# Patient Record
Sex: Female | Born: 1985 | Race: Black or African American | Hispanic: No | Marital: Single | Smoking: Current every day smoker
Health system: Southern US, Community
[De-identification: ages and names within clinical notes are randomized; demographics above are authoritative.]

## PROBLEM LIST (undated history)

## (undated) ENCOUNTER — Inpatient Hospital Stay: Payer: Self-pay

## (undated) HISTORY — PX: INDUCED ABORTION: SHX677

---

## 2013-09-09 ENCOUNTER — Emergency Department: Payer: Self-pay | Admitting: Emergency Medicine

## 2016-06-01 ENCOUNTER — Emergency Department
Admission: EM | Admit: 2016-06-01 | Discharge: 2016-06-01 | Disposition: A | Discharge status: Home / Self Care | Payer: Medicaid Other | Attending: Emergency Medicine | Admitting: Emergency Medicine

## 2016-06-01 ENCOUNTER — Encounter: Payer: Self-pay | Admitting: Emergency Medicine

## 2016-06-01 DIAGNOSIS — R1030 Lower abdominal pain, unspecified: Secondary | ICD-10-CM | POA: Diagnosis not present

## 2016-06-01 DIAGNOSIS — O26892 Other specified pregnancy related conditions, second trimester: Secondary | ICD-10-CM | POA: Diagnosis not present

## 2016-06-01 DIAGNOSIS — F17200 Nicotine dependence, unspecified, uncomplicated: Secondary | ICD-10-CM | POA: Insufficient documentation

## 2016-06-01 DIAGNOSIS — Z3A16 16 weeks gestation of pregnancy: Secondary | ICD-10-CM | POA: Insufficient documentation

## 2016-06-01 DIAGNOSIS — R102 Pelvic and perineal pain: Secondary | ICD-10-CM | POA: Insufficient documentation

## 2016-06-01 LAB — URINALYSIS COMPLETE WITH MICROSCOPIC (ARMC ONLY)
BACTERIA UA: NONE SEEN
Bilirubin Urine: NEGATIVE
GLUCOSE, UA: NEGATIVE mg/dL
Nitrite: NEGATIVE
PROTEIN: NEGATIVE mg/dL
SPECIFIC GRAVITY, URINE: 1.012 (ref 1.005–1.030)
pH: 6 (ref 5.0–8.0)

## 2016-06-01 LAB — COMPREHENSIVE METABOLIC PANEL
ALBUMIN: 3.3 g/dL — AB (ref 3.5–5.0)
ALT: 36 U/L (ref 14–54)
ANION GAP: 6 (ref 5–15)
AST: 35 U/L (ref 15–41)
Alkaline Phosphatase: 42 U/L (ref 38–126)
BILIRUBIN TOTAL: 0.4 mg/dL (ref 0.3–1.2)
BUN: 6 mg/dL (ref 6–20)
CO2: 23 mmol/L (ref 22–32)
Calcium: 8.5 mg/dL — ABNORMAL LOW (ref 8.9–10.3)
Chloride: 104 mmol/L (ref 101–111)
Creatinine, Ser: 0.46 mg/dL (ref 0.44–1.00)
GFR calc Af Amer: >60 mL/min (ref >60)
GFR calc non Af Amer: >60 mL/min (ref >60)
GLUCOSE: 77 mg/dL (ref 65–99)
POTASSIUM: 3.5 mmol/L (ref 3.5–5.1)
SODIUM: 133 mmol/L — AB (ref 135–145)
TOTAL PROTEIN: 6.5 g/dL (ref 6.5–8.1)

## 2016-06-01 LAB — LIPASE, BLOOD: Lipase: 42 U/L (ref 11–51)

## 2016-06-01 LAB — CBC
HEMATOCRIT: 34.6 % — AB (ref 35.0–47.0)
HEMOGLOBIN: 12.2 g/dL (ref 12.0–16.0)
MCH: 34.5 pg — AB (ref 26.0–34.0)
MCHC: 35.3 g/dL (ref 32.0–36.0)
MCV: 97.6 fL (ref 80.0–100.0)
Platelets: 243 10*3/uL (ref 150–440)
RBC: 3.55 MIL/uL — ABNORMAL LOW (ref 3.80–5.20)
RDW: 14 % (ref 11.5–14.5)
WBC: 10.8 10*3/uL (ref 3.6–11.0)

## 2016-06-01 LAB — HCG, QUANTITATIVE, PREGNANCY: HCG, BETA CHAIN, QUANT, S: 35222 m[IU]/mL — AB (ref <5)

## 2016-06-01 NOTE — ED Provider Notes (Signed)
[facility] Emergency Department Provider Note   ____________________________________________    I have reviewed the triage vital signs and the nursing notes.   HISTORY  Chief Complaint Abdominal Pain   HPI Lynn Silva is a 30 y.o. female who presents after an episode of cramping this morning. Patient is [redacted] weeks pregnant, receives OB care at the health department, has gender scan in 4 days at [facility]. Patient reports she was attempting to have a bowel movement this morning and developed a cramping sensation in her pelvis bilaterally. She realized that she had not felt the baby move in 2 days and became concerned. She has no pain, no cramping, no vaginal bleeding currently, she wants to make sure the baby is okay.   History reviewed. No pertinent past medical history.  There are no active problems to display for this patient.   Past Surgical History:  Procedure Laterality Date  . INDUCED ABORTION      Prior to Admission medications   Not on File     Allergies Review of patient's allergies indicates no known allergies.  History reviewed. No pertinent family history.  Social History Social History  Substance Use Topics  . Smoking status: Current Every Day Smoker  . Smokeless tobacco: Never Used  . Alcohol use No    Review of Systems  Constitutional: No fever/chills Eyes: No visual changes.  ENT: No sore throat. Cardiovascular: Denies chest pain. Respiratory: Denies shortness of breath. Gastrointestinal: No abdominal painCurrently.  No nausea, no vomiting.   Genitourinary: Negative for dysuria. No vaginal bleeding Musculoskeletal: Negative for back pain. Skin: Negative for rash. Neurological: Negative for headaches   10-point ROS otherwise negative.  ____________________________________________   PHYSICAL EXAM:  VITAL SIGNS: ED Triage Vitals [06/01/16 1104]  Enc Vitals Group     BP (!) 102/53     Pulse Rate 70     Resp  20     Temp 98.2 F (36.8 C)     Temp Source Oral     SpO2 100 %     Weight 131 lb (59.4 kg)     Height 5\' 5"  (1.651 m)     Head Circumference      Peak Flow      Pain Score 6     Pain Loc      Pain Edu?      Excl. in GC?     Constitutional: Alert and oriented. No acute distress. Pleasant and interactive Eyes: Conjunctivae are normal.  Head: Atraumatic. Nose: No congestion/rhinnorhea. Mouth/Throat: Mucous membranes are moist.   Neck:  Painless ROM Cardiovascular: Normal rate, regular rhythm. Grossly normal heart sounds.  Good peripheral circulation. Respiratory: Normal respiratory effort.  No retractions. Lungs CTAB. Gastrointestinal: Soft and nontender. No distention.  No CVA tenderness. Genitourinary: deferred Musculoskeletal: No lower extremity tenderness nor edema.  Warm and well perfused Neurologic:  Normal speech and language. No gross focal neurologic deficits are appreciated.  Skin:  Skin is warm, dry and intact. No rash noted. Psychiatric: Mood and affect are normal. Speech and behavior are normal.  ____________________________________________   LABS (all labs ordered are listed, but only abnormal results are displayed)  Labs Reviewed  COMPREHENSIVE METABOLIC PANEL - Abnormal; Notable for the following:       Result Value   Sodium 133 (*)    Calcium 8.5 (*)    Albumin 3.3 (*)    All other components within normal limits  CBC - Abnormal; Notable for the  following:    RBC 3.55 (*)    HCT 34.6 (*)    MCH 34.5 (*)    All other components within normal limits  URINALYSIS COMPLETEWITH MICROSCOPIC ([facility] ONLY) - Abnormal; Notable for the following:    Color, Urine YELLOW (*)    APPearance CLOUDY (*)    Ketones, ur 1+ (*)    Hgb urine dipstick 1+ (*)    Leukocytes, UA 2+ (*)    Squamous Epithelial / LPF TOO NUMEROUS TO COUNT (*)    All other components within normal limits  HCG, QUANTITATIVE, PREGNANCY - Abnormal; Notable for the following:    hCG, Beta Chain,  Quant, S 35,222 (*)    All other components within normal limits  LIPASE, BLOOD   ____________________________________________  EKG  None ____________________________________________  RADIOLOGY  None ____________________________________________   PROCEDURES  Procedure(s) performed:   EMBU: fetal movement noted, HR 158, patient reassured  Critical Care performed: No ____________________________________________   INITIAL IMPRESSION / ASSESSMENT AND PLAN / ED COURSE  Pertinent labs & imaging results that were available during my care of the patient were reviewed by me and considered in my medical decision making (see chart for details).  A short well-appearing and in no distress. Vital signs are unremarkable. No abdominal tenderness to palpation. She has no symptoms and has not had any symptoms for several hours. No vaginal bleeding. Bedside ultrasound was reassuring to the patient, she has OB follow-up  Clinical Course   ____________________________________________   FINAL CLINICAL IMPRESSION(S) / ED DIAGNOSES  Final diagnoses:  Lower abdominal pain      NEW MEDICATIONS STARTED DURING THIS VISIT:  New Prescriptions   No medications on file     Note:  This document was prepared using Dragon voice recognition software and may include unintentional dictation errors.    Jene Everyobert Leslee Haueter, MD 06/01/16 1537

## 2016-06-01 NOTE — ED Notes (Signed)
Patient did not have to use the restroom at this time, patient was given a cup to catch some urine when she has to go.

## 2016-06-01 NOTE — ED Triage Notes (Signed)
Pt to ed with c/o abd pain and pelvic pain and pressure since this am.  Pt reports she is approx [redacted] weeks pregnant.  Has been receiving care at health dept.  Pt reports abortion in Jan of this year, G3 P1.  Denies vaginal bleeding at this time.

## 2016-08-29 ENCOUNTER — Encounter: Payer: Self-pay | Admitting: *Deleted

## 2016-08-29 ENCOUNTER — Observation Stay
Admission: EM | Admit: 2016-08-29 | Discharge: 2016-08-29 | Disposition: A | Discharge status: Home / Self Care | Payer: Medicaid Other | Attending: Obstetrics & Gynecology | Admitting: Obstetrics & Gynecology

## 2016-08-29 DIAGNOSIS — R109 Unspecified abdominal pain: Secondary | ICD-10-CM | POA: Insufficient documentation

## 2016-08-29 DIAGNOSIS — O26893 Other specified pregnancy related conditions, third trimester: Principal | ICD-10-CM | POA: Insufficient documentation

## 2016-08-29 DIAGNOSIS — Z3A30 30 weeks gestation of pregnancy: Secondary | ICD-10-CM | POA: Insufficient documentation

## 2016-08-29 DIAGNOSIS — O26899 Other specified pregnancy related conditions, unspecified trimester: Secondary | ICD-10-CM | POA: Diagnosis present

## 2016-08-29 LAB — FETAL FIBRONECTIN: FETAL FIBRONECTIN: NEGATIVE

## 2016-08-29 LAB — URINALYSIS COMPLETE WITH MICROSCOPIC (ARMC ONLY)
BILIRUBIN URINE: NEGATIVE
GLUCOSE, UA: NEGATIVE mg/dL
HGB URINE DIPSTICK: NEGATIVE
KETONES UR: NEGATIVE mg/dL
NITRITE: NEGATIVE
Protein, ur: NEGATIVE mg/dL
RBC / HPF: NONE SEEN RBC/hpf (ref 0–5)
SPECIFIC GRAVITY, URINE: 1.002 — AB (ref 1.005–1.030)
pH: 8 (ref 5.0–8.0)

## 2016-08-29 MED ORDER — ONDANSETRON HCL 4 MG/2ML IJ SOLN: 4.0000 mg | Freq: Four times a day (QID) | INTRAMUSCULAR | Status: DC | PRN

## 2016-08-29 MED ORDER — ACETAMINOPHEN 325 MG PO TABS: 650.0000 mg | ORAL_TABLET | ORAL | Status: DC | PRN

## 2016-08-29 NOTE — Discharge Summary (Signed)
Physician Discharge Summary  Patient ID: Lynn EndoChelsea T Licea MRN: [id number] DOB/AGE: 29/11/1985 28 y.o.  Admit date: 08/29/2016 Discharge date: 08/29/2016  Admission Diagnoses:Pt presents at 30 weeks with upper and lower abdominal pain this am, no VB or ROM or ctxs.  See H&P.    Discharge Diagnoses:  Active Problems:   Abdominal pain affecting pregnancy  Discharged Condition: good  Hospital Course: Pt seen and examined, negative work up for PTL, neg fFN and UA.  Fetal heart rate reassuring.  [facility]    Consults: None  Significant Diagnostic Studies: A NST procedure was performed with FHR monitoring and a normal baseline established, appropriate time of 20-40 minutes of evaluation, and accels >2 seen w 15x15 characteristics.  Results show a REACTIVE NST.   Treatments: none  Discharge Exam: Temperature 98.9 F (37.2 C), temperature source Oral, height 5\' 5"  (1.651 m), weight 140 lb (63.5 kg), last menstrual period 01/29/2016. per HPI  Disposition: 01-Home or Self Care     Medication List    You have not been prescribed any medications.      Signed: Letitia LibraRobert Paul Brenn Deziel 08/29/2016, 3:48 PM

## 2016-08-29 NOTE — H&P (Signed)
Obstetrics Admission History & Physical   CC: Abd Pain   HPI:  30 y.o. G3P1011 @ 5244w3d (11/04/2016, Date entered prior to episode creation). Admitted on 08/29/2016:   Patient Active Problem List   Diagnosis Date Noted  . Abdominal pain affecting pregnancy 08/29/2016     Presents for waking up this morning with upper and lower abdominal pains, no LOF or VB, good FM, no other compl this preg, prior NSVD full term 9 years ago.  Prenatal care at: at [facility]  PMHx: No past medical history on file. PSHx:  Past Surgical History:  Procedure Laterality Date  . INDUCED ABORTION     Medications:  No prescriptions prior to admission.   Allergies: has No Known Allergies. OBHx:  OB History  Gravida Para Term Preterm AB Living  3 1 1   1 1   SAB TAB Ectopic Multiple Live Births               # Outcome Date GA Lbr Len/2nd Weight Sex Delivery Anes PTL Lv  3 Current           2 AB           1 Term              FAO:ZHYQMVHQ/IONGEXBMWUXLFHx:Negative/unremarkable except as detailed in HPI. Soc Hx: Alcohol: none and Recreational drug use: none  Objective:   Vitals:   08/29/16 1306  Temp: 98.9 F (37.2 C)   General: Well nourished, well developed female in no acute distress.  Skin: Warm and dry.  Cardiovascular:Regular rate and rhythm. Respiratory: Clear to auscultation bilateral. Normal respiratory effort Abdomen: mild upper abd pain to palpation, no uterine or lower abd pain, ND, gravid, FHT 140s Neuro/Psych: Normal mood and affect.   Pelvic exam: is not limited by body habitus EGBUS: within normal limits Vagina: within normal limits and with normal mucosa blood in the vault Cervix: closed/50/-3 Uterus: No contractions observed for 30 minutes.  Adnexa: not evaluated  EFM:FHR: 140 bpm Toco: None  Assessment & Plan:   30 y.o. G3P1011 @ 2044w3d, Admitted on 08/29/2016: Abd Pain in Third Trimester Pregnancy; no S/Sx PTL fFN UA Monitoring

## 2016-08-29 NOTE — Discharge Instructions (Signed)
Follow up with your OB provider as scheduled.  Call or return to L&D for more than 6 contractions in one hour, heavy vaginal bleeding like a period, if your water breaks, or decreased fetal movement.

## 2016-08-29 NOTE — Progress Notes (Signed)
[facility] [address] [id number] [place] [address] Phone: [phone] Fax: [phone]  August 29, 2016  Patient: Lynn EndoChelsea T Silva  Date of Birth: 06/04/1986  Date of Visit: 08/29/2016    To Whom It May Concern:  Mick SellChelsea Silva was seen and treated in our [facility] on 08/29/2016. Lynn Silva  may return to work on 08/30/16; restrictions for remainder of pregnancy include allowing her to have frequent breaks and allow to sit at times during her shift, due to third trimester protection of pregnancy..  Sincerely,  Annamarie MajorPaul Ephriam Turman, MD [facility]

## 2016-10-01 NOTE — L&D Delivery Note (Signed)
Delivery Note Primary OB: [facility] Delivery Physician: Annamarie MajorPaul Maysel Mccolm, MD Gestational Age: Full term Antepartum complications: Substance abuse, Smoking history Intrapartum complications: None  A viable Female was delivered via vertex perentation.  Apgars:9 ,9  Weight:  pending .   Placenta status: spontaneous and Intact.  Cord: 3+ vessels;  with the following complications: none.  Anesthesia:  epidural Episiotomy:  none Lacerations:  none Suture Repair: none Est. Blood Loss (mL):  200 mL  Mom to postpartum.  Baby to Couplet care / Skin to Skin.  Annamarie MajorPaul Myonna Chisom, MD Dept of OB/GYN [phone]

## 2016-10-16 ENCOUNTER — Observation Stay
Admission: EM | Admit: 2016-10-16 | Discharge: 2016-10-16 | Disposition: A | Discharge status: Home / Self Care | Payer: Medicaid Other | Attending: Obstetrics and Gynecology | Admitting: Obstetrics and Gynecology

## 2016-10-16 ENCOUNTER — Encounter: Payer: Self-pay | Admitting: *Deleted

## 2016-10-16 DIAGNOSIS — O26893 Other specified pregnancy related conditions, third trimester: Secondary | ICD-10-CM | POA: Diagnosis not present

## 2016-10-16 DIAGNOSIS — R109 Unspecified abdominal pain: Secondary | ICD-10-CM | POA: Diagnosis not present

## 2016-10-16 DIAGNOSIS — F17200 Nicotine dependence, unspecified, uncomplicated: Secondary | ICD-10-CM | POA: Diagnosis not present

## 2016-10-16 DIAGNOSIS — O99333 Smoking (tobacco) complicating pregnancy, third trimester: Secondary | ICD-10-CM | POA: Diagnosis not present

## 2016-10-16 DIAGNOSIS — Z3A37 37 weeks gestation of pregnancy: Secondary | ICD-10-CM | POA: Insufficient documentation

## 2016-10-16 DIAGNOSIS — R112 Nausea with vomiting, unspecified: Secondary | ICD-10-CM | POA: Insufficient documentation

## 2016-10-16 LAB — URINE DRUG SCREEN, QUALITATIVE (ARMC ONLY)
Amphetamines, Ur Screen: NOT DETECTED
BARBITURATES, UR SCREEN: NOT DETECTED
BENZODIAZEPINE, UR SCRN: NOT DETECTED
CANNABINOID 50 NG, UR ~~LOC~~: POSITIVE — AB
COCAINE METABOLITE, UR ~~LOC~~: POSITIVE — AB
MDMA (Ecstasy)Ur Screen: NOT DETECTED
METHADONE SCREEN, URINE: NOT DETECTED
OPIATE, UR SCREEN: NOT DETECTED
Phencyclidine (PCP) Ur S: NOT DETECTED
Tricyclic, Ur Screen: NOT DETECTED

## 2016-10-16 MED ORDER — ONDANSETRON 4 MG PO TBDP: 4.0000 mg | ORAL_TABLET | Freq: Three times a day (TID) | ORAL | 0 refills | Status: DC | PRN

## 2016-10-16 MED ORDER — LACTATED RINGERS IV BOLUS (SEPSIS)
750.0000 mL | Freq: Once | INTRAVENOUS | Status: AC
  Administered 2016-10-16: 750 mL via INTRAVENOUS

## 2016-10-16 MED ORDER — LACTATED RINGERS IV BOLUS (SEPSIS)
250.0000 mL | INTRAVENOUS | Status: AC
  Administered 2016-10-16: 1000 mL via INTRAVENOUS

## 2016-10-16 MED ORDER — ONDANSETRON HCL 4 MG/2ML IJ SOLN
INTRAMUSCULAR | Status: AC
  Filled 2016-10-16: qty 2

## 2016-10-16 MED ORDER — ONDANSETRON HCL 4 MG/2ML IJ SOLN
4.0000 mg | Freq: Once | INTRAMUSCULAR | Status: AC
  Administered 2016-10-16: 4 mg via INTRAVENOUS

## 2016-10-16 NOTE — OB Triage Note (Signed)
Presents with complaint of nausea vomiting and diarrhea since last night, also not sure if she is having contractions.  States she hasnt been able to keep anything down since 830pm last night. Denies any bleeding or leaking of fluid.

## 2016-10-16 NOTE — Discharge Summary (Signed)
Physician Final Progress Note  Patient ID: Lynn EndoChelsea Silva Carel MRN: [id number] DOB/AGE: 30/11/1985 30 y.o.  Admit date: 10/16/2016 Admitting provider: Tresea MallJane Maeby Vankleeck, CNM Discharge date: 10/16/2016   Admission Diagnoses: G3P1011 at 39107w2d with c/o nausea, vomiting, abdominal pain since last night at 10 pm. The last time she vomited was prior to arrival at L&D. She was unable to keep fluids down overnight. Pt admits positive fetal movement. She denies LOF, VB  Discharge Diagnoses:  Active Problems:   Labor and delivery indication for care or intervention IUP at 37107w2d with reactive NST, not in labor  History of Present Illness: Pt admitted for observation, placed on monitors, IV started with 1000ml bolus of LR. Pt is tolerating PO liquids during hospital stay.   History reviewed. No pertinent past medical history.  Past Surgical History:  Procedure Laterality Date  . INDUCED ABORTION      No current facility-administered medications on file prior to encounter.    No current outpatient prescriptions on file prior to encounter.    No Known Allergies  Social History   Social History  . Marital status: Single    Spouse name: N/A  . Number of children: N/A  . Years of education: N/A   Occupational History  . Not on file.   Social History Main Topics  . Smoking status: Current Every Day Smoker  . Smokeless tobacco: Never Used  . Alcohol use No  . Drug use: No  . Sexual activity: Yes   Other Topics Concern  . Not on file   Social History Narrative  . No narrative on file    Physical Exam: Temp 98.3 F (36.8 C) (Oral)   Resp 18   Ht 5\' 6"  (1.676 m)   Wt 150 lb (68 kg)   LMP 01/29/2016   BMI 24.21 kg/m   Gen: NAD CV: RRR Pulm: CTAB Pelvic: 1.5/60/-2 on admission and no change prior to discharge Ext: no evidence of DVT Toco: q 3-7 minutes Fetal Well Being: 150 bpm, moderate variability, +accelerations, -decelerations Category I  Consults: None  Significant  Findings/ Diagnostic Studies: labs:   Results for Lynn Silva, Lynn Silva (MRN [id number]) as of 10/16/2016 16:04  Ref. Range 10/16/2016 13:00  Amphetamines, Ur Screen Latest Ref Range: NONE DETECTED  NONE DETECTED  Barbiturates, Ur Screen Latest Ref Range: NONE DETECTED  NONE DETECTED  Benzodiazepine, Ur Scrn Latest Ref Range: NONE DETECTED  NONE DETECTED  Cocaine Metabolite,Ur [place] Latest Ref Range: NONE DETECTED  POSITIVE (A)  Methadone Scn, Ur Latest Ref Range: NONE DETECTED  NONE DETECTED  MDMA (Ecstasy)Ur Screen Latest Ref Range: NONE DETECTED  NONE DETECTED  Cannabinoid 50 Ng, Ur [place] Latest Ref Range: NONE DETECTED  POSITIVE (A)  Opiate, Ur Screen Latest Ref Range: NONE DETECTED  NONE DETECTED  Phencyclidine (PCP) Ur S Latest Ref Range: NONE DETECTED  NONE DETECTED  Tricyclic, Ur Screen Latest Ref Range: NONE DETECTED  NONE DETECTED    Procedures: NST  Discharge Condition: good  Disposition: 01-Home or Self Care  Diet: Regular diet  Discharge Activity: Activity as tolerated  Instructed patient to not use Cocaine or Marijuana during the pregnancy Instructed to take Tylenol PM today to ease pain of contractions and get rest  Discharge Instructions    Discharge activity:  No Restrictions    Complete by:  As directed    Discharge diet:  No restrictions    Complete by:  As directed    Fetal Kick Count:  Lie on our left side  for one hour after a meal, and count the number of times your baby kicks.  If it is less than 5 times, get up, move around and drink some juice.  Repeat the test 30 minutes later.  If it is still less than 5 kicks in an hour, notify your doctor.    Complete by:  As directed    LABOR:  When conractions begin, you should start to time them from the beginning of one contraction to the beginning  of the next.  When contractions are 5 - 10 minutes apart or less and have been regular for at least an hour, you should call your health care provider.    Complete by:  As  directed    No sexual activity restrictions    Complete by:  As directed    Notify physician for bleeding from the vagina    Complete by:  As directed    Notify physician for blurring of vision or spots before the eyes    Complete by:  As directed    Notify physician for chills or fever    Complete by:  As directed    Notify physician for fainting spells, "black outs" or loss of consciousness    Complete by:  As directed    Notify physician for increase in vaginal discharge    Complete by:  As directed    Notify physician for leaking of fluid    Complete by:  As directed    Notify physician for pain or burning when urinating    Complete by:  As directed    Notify physician for pelvic pressure (sudden increase)    Complete by:  As directed    Notify physician for severe or continued nausea or vomiting    Complete by:  As directed    Notify physician for sudden gushing of fluid from the vagina (with or without continued leaking)    Complete by:  As directed    Notify physician for sudden, constant, or occasional abdominal pain    Complete by:  As directed    Notify physician if baby moving less than usual    Complete by:  As directed      Allergies as of 10/16/2016   No Known Allergies     Medication List    Zofran 4 mg ODT every 8 hours as needed for nausea and vomiting    Follow-up Information    [facility] Follow up.   Why:  schedule follow up prenatal appointment as soon as possible Contact information: [address] [place] [address] [phone]           Total time spent taking care of this patient: 35 minutes  Signed: Tresea Mall, CNM  10/16/2016, 12:46 PM

## 2016-10-21 ENCOUNTER — Inpatient Hospital Stay: Payer: Medicaid Other | Admitting: Anesthesiology

## 2016-10-21 ENCOUNTER — Inpatient Hospital Stay
Admission: EM | Admit: 2016-10-21 | Discharge: 2016-10-23 | DRG: 775 | Disposition: A | Discharge status: Home / Self Care | Payer: Medicaid Other | Attending: Obstetrics & Gynecology | Admitting: Obstetrics & Gynecology

## 2016-10-21 DIAGNOSIS — O99324 Drug use complicating childbirth: Secondary | ICD-10-CM | POA: Diagnosis present

## 2016-10-21 DIAGNOSIS — F17200 Nicotine dependence, unspecified, uncomplicated: Secondary | ICD-10-CM | POA: Diagnosis present

## 2016-10-21 DIAGNOSIS — F1210 Cannabis abuse, uncomplicated: Secondary | ICD-10-CM | POA: Diagnosis present

## 2016-10-21 DIAGNOSIS — Z3A38 38 weeks gestation of pregnancy: Secondary | ICD-10-CM | POA: Diagnosis not present

## 2016-10-21 DIAGNOSIS — Z3493 Encounter for supervision of normal pregnancy, unspecified, third trimester: Secondary | ICD-10-CM | POA: Diagnosis present

## 2016-10-21 DIAGNOSIS — F1410 Cocaine abuse, uncomplicated: Secondary | ICD-10-CM | POA: Diagnosis present

## 2016-10-21 DIAGNOSIS — O99824 Streptococcus B carrier state complicating childbirth: Secondary | ICD-10-CM | POA: Diagnosis present

## 2016-10-21 DIAGNOSIS — F1910 Other psychoactive substance abuse, uncomplicated: Secondary | ICD-10-CM | POA: Diagnosis present

## 2016-10-21 DIAGNOSIS — O99334 Smoking (tobacco) complicating childbirth: Secondary | ICD-10-CM | POA: Diagnosis present

## 2016-10-21 LAB — URINE DRUG SCREEN, QUALITATIVE (ARMC ONLY)
Amphetamines, Ur Screen: NOT DETECTED
Barbiturates, Ur Screen: NOT DETECTED
Benzodiazepine, Ur Scrn: NOT DETECTED
COCAINE METABOLITE, UR ~~LOC~~: POSITIVE — AB
Cannabinoid 50 Ng, Ur ~~LOC~~: POSITIVE — AB
MDMA (ECSTASY) UR SCREEN: NOT DETECTED
METHADONE SCREEN, URINE: NOT DETECTED
Opiate, Ur Screen: NOT DETECTED
Phencyclidine (PCP) Ur S: NOT DETECTED
TRICYCLIC, UR SCREEN: NOT DETECTED

## 2016-10-21 LAB — CBC
HEMATOCRIT: 37.3 % (ref 35.0–47.0)
Hemoglobin: 12.9 g/dL (ref 12.0–16.0)
MCH: 33.1 pg (ref 26.0–34.0)
MCHC: 34.6 g/dL (ref 32.0–36.0)
MCV: 95.9 fL (ref 80.0–100.0)
Platelets: 199 10*3/uL (ref 150–440)
RBC: 3.89 MIL/uL (ref 3.80–5.20)
RDW: 14.1 % (ref 11.5–14.5)
WBC: 12.7 10*3/uL — ABNORMAL HIGH (ref 3.6–11.0)

## 2016-10-21 LAB — TYPE AND SCREEN
ABO/RH(D): O POS
Antibody Screen: NEGATIVE

## 2016-10-21 MED ORDER — TETANUS-DIPHTH-ACELL PERTUSSIS 5-2.5-18.5 LF-MCG/0.5 IM SUSP: 0.5000 mL | Freq: Once | INTRAMUSCULAR | Status: DC

## 2016-10-21 MED ORDER — FENTANYL 2.5 MCG/ML W/ROPIVACAINE 0.2% IN NS 100 ML EPIDURAL INFUSION (ARMC-ANES): 10.0000 mL/h | EPIDURAL | Status: DC

## 2016-10-21 MED ORDER — OXYTOCIN BOLUS FROM INFUSION
500.0000 mL | Freq: Once | INTRAVENOUS | Status: AC
  Administered 2016-10-21: 500 mL via INTRAVENOUS

## 2016-10-21 MED ORDER — PHENYLEPHRINE 40 MCG/ML (10ML) SYRINGE FOR IV PUSH (FOR BLOOD PRESSURE SUPPORT)
80.0000 ug | PREFILLED_SYRINGE | INTRAVENOUS | Status: DC | PRN
  Filled 2016-10-21: qty 5

## 2016-10-21 MED ORDER — EPHEDRINE 5 MG/ML INJ
10.0000 mg | INTRAVENOUS | Status: DC | PRN
  Filled 2016-10-21: qty 2

## 2016-10-21 MED ORDER — ACETAMINOPHEN 325 MG PO TABS
650.0000 mg | ORAL_TABLET | ORAL | Status: DC | PRN
  Administered 2016-10-22: 650 mg via ORAL
  Filled 2016-10-21: qty 2

## 2016-10-21 MED ORDER — SODIUM CHLORIDE 0.9 % IV SOLN: 250.0000 mL | INTRAVENOUS | Status: DC | PRN

## 2016-10-21 MED ORDER — ONDANSETRON HCL 4 MG/2ML IJ SOLN: 4.0000 mg | INTRAMUSCULAR | Status: DC | PRN

## 2016-10-21 MED ORDER — LIDOCAINE HCL (PF) 1 % IJ SOLN
INTRAMUSCULAR | Status: AC
  Filled 2016-10-21: qty 30

## 2016-10-21 MED ORDER — DIBUCAINE 1 % RE OINT: 1.0000 "application " | TOPICAL_OINTMENT | RECTAL | Status: DC | PRN

## 2016-10-21 MED ORDER — SODIUM CHLORIDE 0.9% FLUSH: 3.0000 mL | Freq: Two times a day (BID) | INTRAVENOUS | Status: DC

## 2016-10-21 MED ORDER — DIPHENHYDRAMINE HCL 50 MG/ML IJ SOLN: 12.5000 mg | INTRAMUSCULAR | Status: DC | PRN

## 2016-10-21 MED ORDER — LACTATED RINGERS IV SOLN
500.0000 mL | Freq: Once | INTRAVENOUS | Status: AC
  Administered 2016-10-21: 500 mL via INTRAVENOUS

## 2016-10-21 MED ORDER — SENNOSIDES-DOCUSATE SODIUM 8.6-50 MG PO TABS
2.0000 | ORAL_TABLET | ORAL | Status: DC
  Administered 2016-10-22 – 2016-10-23 (×2): 2 via ORAL
  Filled 2016-10-21 (×2): qty 2

## 2016-10-21 MED ORDER — SODIUM CHLORIDE 0.9% FLUSH: 3.0000 mL | INTRAVENOUS | Status: DC | PRN

## 2016-10-21 MED ORDER — ONDANSETRON HCL 4 MG/2ML IJ SOLN: 4.0000 mg | Freq: Four times a day (QID) | INTRAMUSCULAR | Status: DC | PRN

## 2016-10-21 MED ORDER — MISOPROSTOL 200 MCG PO TABS
ORAL_TABLET | ORAL | Status: AC
  Filled 2016-10-21: qty 4

## 2016-10-21 MED ORDER — BUTORPHANOL TARTRATE 1 MG/ML IJ SOLN: 1.0000 mg | INTRAMUSCULAR | Status: DC | PRN

## 2016-10-21 MED ORDER — LACTATED RINGERS IV SOLN: 500.0000 mL | INTRAVENOUS | Status: DC | PRN

## 2016-10-21 MED ORDER — ONDANSETRON HCL 4 MG PO TABS: 4.0000 mg | ORAL_TABLET | ORAL | Status: DC | PRN

## 2016-10-21 MED ORDER — OXYTOCIN 10 UNIT/ML IJ SOLN
INTRAMUSCULAR | Status: AC
  Filled 2016-10-21: qty 2

## 2016-10-21 MED ORDER — FENTANYL 2.5 MCG/ML W/ROPIVACAINE 0.2% IN NS 100 ML EPIDURAL INFUSION (ARMC-ANES)
EPIDURAL | Status: DC | PRN
  Administered 2016-10-21: 10 mL/h via EPIDURAL

## 2016-10-21 MED ORDER — DIPHENHYDRAMINE HCL 25 MG PO CAPS: 25.0000 mg | ORAL_CAPSULE | Freq: Four times a day (QID) | ORAL | Status: DC | PRN

## 2016-10-21 MED ORDER — ZOLPIDEM TARTRATE 5 MG PO TABS: 5.0000 mg | ORAL_TABLET | Freq: Every evening | ORAL | Status: DC | PRN

## 2016-10-21 MED ORDER — IBUPROFEN 600 MG PO TABS
600.0000 mg | ORAL_TABLET | Freq: Four times a day (QID) | ORAL | Status: DC
  Administered 2016-10-21 – 2016-10-23 (×7): 600 mg via ORAL
  Filled 2016-10-21 (×8): qty 1

## 2016-10-21 MED ORDER — SIMETHICONE 80 MG PO CHEW: 80.0000 mg | CHEWABLE_TABLET | ORAL | Status: DC | PRN

## 2016-10-21 MED ORDER — OXYTOCIN 40 UNITS IN LACTATED RINGERS INFUSION - SIMPLE MED
2.5000 [IU]/h | INTRAVENOUS | Status: DC
  Administered 2016-10-21: 2.5 [IU]/h via INTRAVENOUS
  Filled 2016-10-21: qty 1000

## 2016-10-21 MED ORDER — AMMONIA AROMATIC IN INHA
RESPIRATORY_TRACT | Status: AC
  Filled 2016-10-21: qty 10

## 2016-10-21 MED ORDER — SODIUM CHLORIDE 0.9 % IV SOLN
1.0000 g | INTRAVENOUS | Status: DC
  Administered 2016-10-21: 1 g via INTRAVENOUS
  Filled 2016-10-21: qty 1000

## 2016-10-21 MED ORDER — LACTATED RINGERS IV SOLN
INTRAVENOUS | Status: DC
  Administered 2016-10-21 (×2): via INTRAVENOUS

## 2016-10-21 MED ORDER — BENZOCAINE-MENTHOL 20-0.5 % EX AERO: 1.0000 "application " | INHALATION_SPRAY | CUTANEOUS | Status: DC | PRN

## 2016-10-21 MED ORDER — ACETAMINOPHEN 325 MG PO TABS: 650.0000 mg | ORAL_TABLET | ORAL | Status: DC | PRN

## 2016-10-21 MED ORDER — COCONUT OIL OIL: 1.0000 "application " | TOPICAL_OIL | Status: DC | PRN

## 2016-10-21 MED ORDER — SODIUM CHLORIDE 0.9 % IV SOLN
INTRAVENOUS | Status: AC
  Filled 2016-10-21: qty 2000

## 2016-10-21 MED ORDER — AMPICILLIN SODIUM 2 G IJ SOLR
2.0000 g | Freq: Once | INTRAMUSCULAR | Status: AC
  Administered 2016-10-21: 2 g via INTRAVENOUS

## 2016-10-21 MED ORDER — BUPIVACAINE HCL (PF) 0.25 % IJ SOLN
INTRAMUSCULAR | Status: DC | PRN
  Administered 2016-10-21: 5 mL via EPIDURAL

## 2016-10-21 MED ORDER — LIDOCAINE HCL (PF) 1 % IJ SOLN
INTRAMUSCULAR | Status: DC | PRN
  Administered 2016-10-21: 3 mL via SUBCUTANEOUS

## 2016-10-21 MED ORDER — LIDOCAINE-EPINEPHRINE (PF) 1.5 %-1:200000 IJ SOLN
INTRAMUSCULAR | Status: DC | PRN
  Administered 2016-10-21: 4 mL via EPIDURAL

## 2016-10-21 MED ORDER — WITCH HAZEL-GLYCERIN EX PADS: 1.0000 "application " | MEDICATED_PAD | CUTANEOUS | Status: DC | PRN

## 2016-10-21 MED ORDER — FENTANYL 2.5 MCG/ML W/ROPIVACAINE 0.2% IN NS 100 ML EPIDURAL INFUSION (ARMC-ANES)
EPIDURAL | Status: AC
  Filled 2016-10-21: qty 100

## 2016-10-21 NOTE — Anesthesia Procedure Notes (Signed)
Epidural Patient location during procedure: OB  Staffing Performed: anesthesiologist   Preanesthetic Checklist Completed: patient identified, site marked, surgical consent, pre-op evaluation, timeout performed, IV checked, risks and benefits discussed and monitors and equipment checked  Epidural Patient position: sitting Prep: Betadine Patient monitoring: heart rate, continuous pulse ox and blood pressure Approach: midline Location: L4-L5 Injection technique: LOR saline  Needle:  Needle type: Tuohy  Needle gauge: 17 G Needle length: 9 cm and 9 Needle insertion depth: 4 cm Catheter type: closed end flexible Catheter size: 19 Gauge Catheter at skin depth: 9 cm Test dose: negative and 1.5% lidocaine with Epi 1:200 K  Assessment Sensory level: T10 Events: blood not aspirated, injection not painful, no injection resistance, negative IV test and no paresthesia  Additional Notes   Patient tolerated the insertion well without complications.-SATD -IVTD. No paresthesia. Refer to [facility] nursing for VS and dosingReason for block:procedure for pain     

## 2016-10-21 NOTE — Anesthesia Preprocedure Evaluation (Signed)
Anesthesia Evaluation  Patient identified by MRN, date of birth, ID band Patient awake    Reviewed: Allergy & Precautions, H&P , NPO status , Patient's Chart, lab work & pertinent test results, reviewed documented beta blocker date and time   Airway Mallampati: II  TM Distance: >3 FB Neck ROM: full    Dental no notable dental hx. (+) Teeth Intact   Pulmonary neg pulmonary ROS, Current Smoker,    Pulmonary exam normal breath sounds clear to auscultation       Cardiovascular Exercise Tolerance: Good negative cardio ROS   Rhythm:regular Rate:Normal     Neuro/Psych negative neurological ROS  negative psych ROS   GI/Hepatic negative GI ROS, Neg liver ROS,   Endo/Other  negative endocrine ROSdiabetes  Renal/GU      Musculoskeletal   Abdominal   Peds  Hematology negative hematology ROS (+)   Anesthesia Other Findings   Reproductive/Obstetrics (+) Pregnancy                             Anesthesia Physical Anesthesia Plan  ASA: II  Anesthesia Plan: Epidural   Post-op Pain Management:    Induction:   Airway Management Planned:   Additional Equipment:   Intra-op Plan:   Post-operative Plan:   Informed Consent: I have reviewed the patients History and Physical, chart, labs and discussed the procedure including the risks, benefits and alternatives for the proposed anesthesia with the patient or authorized representative who has indicated his/her understanding and acceptance.     Plan Discussed with:   Anesthesia Plan Comments: (Pt with pos hx of cocaine with pos tox on 16th.  Denies any use since that time and understands the risks benefit profile of subsequent epidural anesthesia. She vehemently denies any repeat exposure of cocaine or other illicit drugs since the 16th.  JA)       Anesthesia Quick Evaluation

## 2016-10-21 NOTE — OB Triage Note (Signed)
Pt presents to triage, complaining of pain in lower abdomen that started around midnight coming consistently around every ten minutes now coming about every five minutes, she rates the pain 8. She has noticed some bleeding but no leaking or gushes of water.

## 2016-10-21 NOTE — Discharge Summary (Signed)
Obstetrical Discharge Summary  Date of Admission: 10/21/2016 Date of Discharge: 10/23/2016 Discharge Diagnosis: Term Pregnancy-delivered Primary OB:  [facility]   Delivery Note Primary OB: [facility] Delivery Physician: Paul Harris, MD Gestational Age: Full term Antepartum complications: Substance abuse, Smoking history Intrapartum complications: None  A viable Female was delivered via vertex perentation.  Apgars:9 ,9  Weight:  6#5.9oz (2890 gm) Placenta status: spontaneous and Intact.  Cord: 3+ vessels;  with the following complications: none.  Anesthesia:  epidural Episiotomy:  none Lacerations:  none Suture Repair: none Est. Blood Loss (mL):  200 mL  Mom to postpartum.  Baby to Couplet care / Skin to Skin.   Post partum course: Since the delivery, patient has tolerate activity, diet, and daily functions without difficulty or complication.  Min lochia.  No breast concerns at this time.  No signs of depression currently. Has been seen by social worker and CPS has been notified and will follow up on baby (positive cocaine and MJ in baby and mother). Patient to also follow up with [facility].  Postpartum Exam:General appearance: alert, no distress and distracted on her phone GI: Fundus firm and at U/ ML/ NT Extremities: no edema, redness or tenderness in the calves or thighs Lochia: minimal  Disposition: home with infant Rh Immune globulin given: not applicable Rubella vaccine given: not applicable Varicella vaccine given: not applicable Tdap vaccine given in AP or PP setting: yes Flu vaccine given in AP or PP setting: yes Contraception: condoms. Wants BTL, but awaiting Medicaid approval, then needs to sign 30 day papers.  Prenatal Labs: O POS//Rubella Immune (MMR x 2 )/ VI/RPR negative//HIV negative/HepB Surface Ag negative//plans to bottle feed  Plan:  Lynn Silva was discharged to home in good condition. Follow-up appointment with PNC provider in 6 weeks  Discharge  Medications: Allergies as of 10/23/2016   No Known Allergies     Medication List    STOP taking these medications   ondansetron 4 MG disintegrating tablet Commonly known as:  ZOFRAN ODT     TAKE these medications   CONCEPT DHA 53.5-38-1 MG Caps Take 30 capsules by mouth daily.   ibuprofen 600 MG tablet Commonly known as:  ADVIL,MOTRIN Take 1 tablet (600 mg total) by mouth every 6 (six) hours as needed for mild pain or moderate pain.        GUTIERREZ, COLLEEN, CNM  

## 2016-10-21 NOTE — Progress Notes (Signed)
  Labor Progress Note   31 y.o. [id number] @ 3923w0d , admitted for  Pregnancy, Labor Management.   Subjective:  Pain improved after epidural  Objective:  BP 107/72   Pulse 67   Temp 97.6 F (36.4 C) (Oral)   Resp 16   Ht 5\' 5"  (1.651 m)   Wt 68.9 kg (152 lb)   LMP 01/29/2016   BMI 25.29 kg/m  Abd: mild Extr: trace to 1+ bilateral pedal edema SVE: CERVIX: 7 cm dilated, 80 effaced, -2 station AROM clear  EFM: FHR: 140 bpm, variability: moderate,  accelerations:  Present,  decelerations:  Absent Toco: Frequency: Every 3 minutes Labs:  Current lab results:  Recent Labs  10/21/16 1124  WBC 12.7*  HGB 12.9  HCT 37.3     Assessment & Plan:  G3P1011 @ 2623w0d, admitted for  Pregnancy and Labor/Delivery Management  1. Pain management: epidural. 2. FWB: FHT category 1.  3. ID: GBS positive 4. Labor management: AROM, monitor Counseled on POS DRUG SCREEN for Cocaine and MJ; risks to baby; neonatal to monitor and assess baby per protocol,; rec not to breast feed with drugs in system  All discussed with patient, see orders

## 2016-10-21 NOTE — H&P (Signed)
Obstetrics Admission History & Physical   CC: Ctxs   HPI:  31 y.o. G3P1011 @ 2874w0d (11/04/2016, Date entered prior to episode creation). Admitted on 10/21/2016:   Patient Active Problem List   Diagnosis Date Noted  . Labor and delivery indication for care or intervention 10/16/2016  . Abdominal pain affecting pregnancy 08/29/2016     Presents for painful ctxs since last night and bloody show this am.  No ROM.  Good FM.  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 Prior NSVD 9 years ago. Prenatal care at: at [facility]  PMHx: No past medical history on file. PSHx:  Past Surgical History:  Procedure Laterality Date  . INDUCED ABORTION     Medications:  Prescriptions Prior to Admission  Medication Sig Dispense Refill Last Dose  . ondansetron (ZOFRAN ODT) 4 MG disintegrating tablet Take 1 tablet (4 mg total) by mouth every 8 (eight) hours as needed for nausea or vomiting. 12 tablet 0 10/20/2016   Allergies: has No Known Allergies. OBHx:  OB History  Gravida Para Term Preterm AB Living  3 1 1   1 1   SAB TAB Ectopic Multiple Live Births               # Outcome Date GA Lbr Len/2nd Weight Sex Delivery Anes PTL Lv  3 Current           2 AB           1 Term              ZOX:WRUEAVWU/[id number]:Negative/unremarkable except as detailed in HPI. Soc Hx: Current smoker, Alcohol: none and Recreational drug use: current: (MJ, Cocaine, denies recent use)  Objective:   Vitals:   10/21/16 1102 10/21/16 1103  BP: 107/62   Pulse: 68   Resp:  16  Temp:  97.6 F (36.4 C)   General: Well nourished, well developed female in no acute distress.  Skin: Warm and dry.  Cardiovascular:Regular rate and rhythm. Respiratory: Clear to auscultation bilateral. Normal respiratory effort Abdomen: marked Neuro/Psych: Normal mood and affect.   Pelvic exam: is not limited by body habitus EGBUS: within normal limits Vagina: within normal limits and with normal mucosa blood in the  vault Cervix: 5/90/0, BBOW Uterus: Spontaneous uterine activity  Adnexa: not evaluated  EFM:FHR: 140 bpm, variability: moderate,  accelerations:  Present,  decelerations:  Absent Toco: Frequency: Every 2-4 minutes   Perinatal info:  Blood type: O positive Rubella- Immune Varicella -Immune TDaP tetanus status unknown to the patient RPR NR / HIV Neg/ HBsAg Neg  GBS POS  Assessment & Plan:   31 y.o. G3P1011 @ 4174w0d, Admitted on 10/21/2016: ACTIVE LABOR     Admit for labor, Antibiotics for GBS prophylaxis, Observe for cervical change, Fetal Wellbeing Reassuring, Epidural when ready and AROM when Appropriate

## 2016-10-22 LAB — CBC
HCT: 36.8 % (ref 35.0–47.0)
HEMOGLOBIN: 12.7 g/dL (ref 12.0–16.0)
MCH: 33.3 pg (ref 26.0–34.0)
MCHC: 34.7 g/dL (ref 32.0–36.0)
MCV: 96.1 fL (ref 80.0–100.0)
Platelets: 184 10*3/uL (ref 150–440)
RBC: 3.83 MIL/uL (ref 3.80–5.20)
RDW: 14.1 % (ref 11.5–14.5)
WBC: 14.8 10*3/uL — ABNORMAL HIGH (ref 3.6–11.0)

## 2016-10-22 NOTE — Progress Notes (Signed)
Monica SW here to see patient

## 2016-10-22 NOTE — Anesthesia Postprocedure Evaluation (Signed)
Anesthesia Post Note  Patient: Lynn Silva  Procedure(s) Performed: * No procedures listed *  Patient location during evaluation: Mother Baby Anesthesia Type: Epidural Level of consciousness: awake and alert, oriented and patient cooperative Pain management: pain level controlled Vital Signs Assessment: post-procedure vital signs reviewed and stable Respiratory status: spontaneous breathing, nonlabored ventilation and respiratory function stable Cardiovascular status: stable Postop Assessment: no headache and epidural receding Anesthetic complications: no     Last Vitals:  Vitals:   10/21/16 2310 10/22/16 0338  BP: 113/72 106/66  Pulse: 61 80  Resp: 20 18  Temp: 36.6 C 36.5 C    Last Pain:  Vitals:   10/22/16 0351  TempSrc:   PainSc: 0-No pain                 Michaele OfferSavage,  Keynan Heffern A

## 2016-10-22 NOTE — Progress Notes (Signed)
Post Partum Day 1  Subjective: Doing well, no complaints.  Tolerating regular diet, pain with PO meds, voiding and ambulating without difficulty.  No CP SOB F/C N/V or leg pain No HA, change of vision, RUQ/epigastric pain  Objective: BP 114/69 (BP Location: Left Arm)   Pulse 61   Temp 97.8 F (36.6 C) (Oral)   Resp 20   Ht 5\' 5"  (1.651 m)   Wt 152 lb (68.9 kg)   LMP 01/29/2016   SpO2 100%    BMI 25.29 kg/m    Physical Exam:  General: NAD CV: RRR Pulm: nl effort, CTABL Lochia: moderate Uterine Fundus: fundus firm and below umbilicus DVT Evaluation: no cords, ttp LEs    Recent Labs  10/21/16 1124 10/22/16 0656  HGB 12.9 12.7  HCT 37.3 36.8  WBC 12.7* 14.8*  PLT 199 184    Assessment/Plan: 30 y.o. [id number] postpartum day # 1  1. Continue routine postpartum care 2. Social work consult ordered due to positive marijuana, cocaine drug screen positive mom and baby 3. O positive, Rubella Immune, Varicella Immune 4. TDAP received November 2017 at health department 5. Bottle feeding/Contraception: Desires BTL, needs to sign consent form 6. Disposition: Discharge to home Postpartum Day 2    Lynn Silva, CNM
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
## 2016-10-23 ENCOUNTER — Encounter: Payer: Self-pay | Admitting: Certified Nurse Midwife

## 2016-10-23 DIAGNOSIS — F1910 Other psychoactive substance abuse, uncomplicated: Secondary | ICD-10-CM | POA: Diagnosis present

## 2016-10-23 LAB — RPR: RPR Ser Ql: NONREACTIVE

## 2016-10-23 MED ORDER — CONCEPT DHA 53.5-38-1 MG PO CAPS: 30.0000 | ORAL_CAPSULE | Freq: Every day | ORAL | 2 refills | Status: DC

## 2016-10-23 MED ORDER — IBUPROFEN 600 MG PO TABS: 600.0000 mg | ORAL_TABLET | Freq: Four times a day (QID) | ORAL | 0 refills | Status: DC | PRN

## 2016-10-23 NOTE — Progress Notes (Signed)
Patient discharged home at 1250.

## 2016-10-23 NOTE — Progress Notes (Signed)
Discharge instructions complete and prescriptions given by provider. Patient verbalizes understanding of teaching.

## 2016-10-23 NOTE — Clinical Social Work Note (Signed)
CSW has updated patient's assigned DSS CPS caseworker: Francisco CapuchinLatasha Logan that patient and newborn discharged to home today. York SpanielMonica Lavonda Thal MSW,LCSW [phone]

## 2016-10-23 NOTE — Discharge Instructions (Signed)
Vaginal Delivery, Care After Refer to this sheet in the next few weeks. These instructions provide you with information about caring for yourself after vaginal delivery. Your health care provider may also give you more specific instructions. Your treatment has been planned according to current medical practices, but problems sometimes occur. Call your health care provider if you have any problems or questions. What can I expect after the procedure? After vaginal delivery, it is common to have:  Some bleeding from your vagina.  Soreness in your abdomen, your vagina, and the area of skin between your vaginal opening and your anus (perineum).  Pelvic cramps.  Fatigue. Follow these instructions at home: Medicines  Take over-the-counter and prescription medicines only as told by your health care provider.  If you were prescribed an antibiotic medicine, take it as told by your health care provider. Do not stop taking the antibiotic until it is finished. Driving  Do not drive or operate heavy machinery while taking prescription pain medicine.  Do not drive for 24 hours if you received a sedative. Lifestyle  Do not drink alcohol. This is especially important if you are breastfeeding or taking medicine to relieve pain.  Do not use tobacco products, including cigarettes, chewing tobacco, or e-cigarettes. If you need help quitting, ask your health care provider. Eating and drinking  Drink at least 8 eight-ounce glasses of water every day unless you are told not to by your health care provider. If you choose to breastfeed your baby, you may need to drink more water than this.  Eat high-fiber foods every day. These foods may help prevent or relieve constipation. High-fiber foods include:  Whole grain cereals and breads.  Brown rice.  Beans.  Fresh fruits and vegetables. Activity  Return to your normal activities as told by your health care provider. Ask your health care provider what  activities are safe for you.  Rest as much as possible. Try to rest or take a nap when your baby is sleeping.  Do not lift anything that is heavier than your baby or 10 lb (4.5 kg) until your health care provider says that it is safe.  Do not have vaginal intercourse for 4-6 weeks after having your baby, or longer depending on your:  Risk of infection.  Rate of healing.  Comfort and desire to engage in sexual activity. Vaginal Care  If you have an episiotomy or a vaginal tear, check the area every day for signs of infection. Check for:  More redness, swelling, or pain.  More fluid or blood.  Warmth.  Pus or a bad smell.  Do not use tampons or douches until your health care provider says this is safe.  Watch for any blood clots that may pass from your vagina. These may look like clumps of dark red, brown, or black discharge. General instructions  Keep your perineum clean and dry as told by your health care provider.  Wear loose, comfortable clothing.  Wipe from front to back when you use the toilet.  Ask your health care provider if you can shower or take a bath. If you had an episiotomy or a perineal tear during labor and delivery, your health care provider may tell you not to take baths for a certain length of time.  Wear a bra that supports your breasts and fits you well.  If possible, have someone help you with household activities and help care for your baby for at least a few days after you leave the hospital.  Keep all follow-up visits for you and your baby as told by your health care provider. This is important. Contact a health care provider if:  You have:  Vaginal discharge that has a bad smell.  Difficulty urinating.  Pain when urinating.  A sudden increase or decrease in the frequency of your bowel movements.  More redness, swelling, or pain around your episiotomy or vaginal tear.  More fluid or blood coming from your episiotomy or vaginal  tear.  Pus or a bad smell coming from your episiotomy or vaginal tear.  A fever.  A rash.  Little or no interest in activities you used to enjoy.  Questions about caring for yourself or your baby.  Your episiotomy or vaginal tear feels warm to the touch.  Your episiotomy or vaginal tear is separating or does not appear to be healing.  Your breasts are painful, hard, or turn red.  You feel unusually sad or worried.  You feel nauseous or you vomit.  You pass large blood clots from your vagina. If you pass a blood clot from your vagina, save it to show to your health care provider. Do not flush blood clots down the toilet without having your health care provider look at them.  You urinate more than usual.  You are dizzy or light-headed.  You have not breastfed at all and you have not had a menstrual period for 12 weeks after delivery.  You have stopped breastfeeding and you have not had a menstrual period for 12 weeks after you stopped breastfeeding. Get help right away if:  You have:  Pain that does not go away or does not get better with medicine.  Chest pain.  Difficulty breathing.  Blurred vision or spots in your vision.  Thoughts about hurting yourself or your baby.  You develop pain in your abdomen or in one of your legs.  You develop a severe headache.  You faint.  You bleed from your vagina so much that you fill two sanitary pads in one hour. This information is not intended to replace advice given to you by your health care provider. Make sure you discuss any questions you have with your health care provider. Document Released: 09/14/2000 Document Revised: 02/29/2016 Document Reviewed: 10/02/2015 Elsevier Interactive Patient Education  2017 [employer].

## 2016-12-10 ENCOUNTER — Ambulatory Visit: Payer: Self-pay | Admitting: Obstetrics & Gynecology

## 2020-06-10 ENCOUNTER — Emergency Department (HOSPITAL_COMMUNITY): Payer: HRSA Program

## 2020-06-10 ENCOUNTER — Emergency Department (HOSPITAL_COMMUNITY)
Admission: EM | Admit: 2020-06-10 | Discharge: 2020-06-10 | Disposition: A | Discharge status: Home / Self Care | Payer: HRSA Program | Attending: Emergency Medicine | Admitting: Emergency Medicine

## 2020-06-10 ENCOUNTER — Encounter (HOSPITAL_COMMUNITY): Payer: Self-pay | Admitting: *Deleted

## 2020-06-10 ENCOUNTER — Other Ambulatory Visit: Payer: Self-pay

## 2020-06-10 DIAGNOSIS — R079 Chest pain, unspecified: Secondary | ICD-10-CM | POA: Diagnosis present

## 2020-06-10 DIAGNOSIS — U071 COVID-19: Secondary | ICD-10-CM | POA: Diagnosis not present

## 2020-06-10 DIAGNOSIS — F17210 Nicotine dependence, cigarettes, uncomplicated: Secondary | ICD-10-CM | POA: Insufficient documentation

## 2020-06-10 LAB — BASIC METABOLIC PANEL
Anion gap: 7 (ref 5–15)
BUN: 5 mg/dL — ABNORMAL LOW (ref 6–20)
CO2: 25 mmol/L (ref 22–32)
Calcium: 9.2 mg/dL (ref 8.9–10.3)
Chloride: 106 mmol/L (ref 98–111)
Creatinine, Ser: 0.78 mg/dL (ref 0.44–1.00)
GFR calc Af Amer: >60 mL/min (ref >60)
GFR calc non Af Amer: >60 mL/min (ref >60)
Glucose, Bld: 80 mg/dL (ref 70–99)
Potassium: 4.1 mmol/L (ref 3.5–5.1)
Sodium: 138 mmol/L (ref 135–145)

## 2020-06-10 LAB — CBC
HCT: 40 % (ref 36.0–46.0)
Hemoglobin: 13.2 g/dL (ref 12.0–15.0)
MCH: 32.7 pg (ref 26.0–34.0)
MCHC: 33 g/dL (ref 30.0–36.0)
MCV: 99 fL (ref 80.0–100.0)
Platelets: 293 10*3/uL (ref 150–400)
RBC: 4.04 MIL/uL (ref 3.87–5.11)
RDW: 13.2 % (ref 11.5–15.5)
WBC: 5.6 10*3/uL (ref 4.0–10.5)
nRBC: 0 % (ref 0.0–0.2)

## 2020-06-10 LAB — SARS CORONAVIRUS 2 BY RT PCR (HOSPITAL ORDER, PERFORMED IN ~~LOC~~ HOSPITAL LAB): SARS Coronavirus 2: POSITIVE — AB

## 2020-06-10 LAB — I-STAT BETA HCG BLOOD, ED (MC, WL, AP ONLY): I-stat hCG, quantitative: <5 m[IU]/mL (ref <5)

## 2020-06-10 LAB — TROPONIN I (HIGH SENSITIVITY): Troponin I (High Sensitivity): <2 ng/L (ref <18)

## 2020-06-10 NOTE — ED Provider Notes (Signed)
[facility] EMERGENCY DEPARTMENT Provider Note   CSN: [id number] Arrival date & time: 06/10/20  6283     History Chief Complaint  Patient presents with  . Chest Pain    Lynn Silva is a 34 y.o. female with no recent past medical history who presents today for evaluation of left-sided chest pain.  She reports that her significant other tested positive for Covid recently.  Patient has not been vaccinated and states she primarily came here to get tested.  She reports the pain on her left chest does not radiate or move.  Is worse with coughing.  She denies any alleviating factors.  She denies any previous cardiac diagnoses or pulmonary problems.  She additionally reports intermittent headaches, fatigue, myalgias, arthralgias, and generally feeling unwell.  No reported fevers at home.   She is not short of breath, does not take any hormones/estrogen.  No leg swelling. No vomiting or diarrhea.   HPI     History reviewed. No pertinent past medical history.  Patient Active Problem List   Diagnosis Date Noted  . Substance abuse (HCC) 10/23/2016  . Postpartum care following vaginal delivery 10/21/2016    Past Surgical History:  Procedure Laterality Date  . INDUCED ABORTION       OB History    Gravida  3   Para  2   Term  2   Preterm      AB  1   Living  2     SAB      TAB      Ectopic      Multiple  0   Live Births  1           No family history on file.  Social History   Tobacco Use  . Smoking status: Current Every Day Smoker    Packs/day: 0.50    Years: 15.00    Pack years: 7.50    Types: Cigarettes  . Smokeless tobacco: Never Used  Substance Use Topics  . Alcohol use: No  . Drug use: Yes    Types: Marijuana, Cocaine    Comment: marj "last night"; cocaine within past two weeks per patient    Home Medications Prior to Admission medications   Medication Sig Start Date End Date Taking? Authorizing Provider  ibuprofen  (ADVIL,MOTRIN) 600 MG tablet Take 1 tablet (600 mg total) by mouth every 6 (six) hours as needed for mild pain or moderate pain. 10/23/16   Farrel Conners, CNM  Prenat-FeFum-FePo-FA-Omega 3 (CONCEPT DHA) 53.5-38-1 MG CAPS Take 30 capsules by mouth daily. 10/23/16   Farrel Conners, CNM    Allergies    Patient has no known allergies.  Review of Systems   Review of Systems  Constitutional: Positive for chills and fatigue. Negative for fever.  HENT: Negative for congestion.   Eyes: Negative for visual disturbance.  Respiratory: Positive for cough. Negative for shortness of breath and wheezing.   Cardiovascular: Positive for chest pain. Negative for palpitations and leg swelling.  Gastrointestinal: Negative for abdominal pain, diarrhea and vomiting.  Genitourinary: Negative for dysuria and pelvic pain.  Musculoskeletal: Positive for arthralgias and myalgias.  Skin: Negative for color change and rash.  Neurological: Positive for headaches (Intermittent, changes location). Negative for seizures and weakness.  All other systems reviewed and are negative.   Physical Exam Updated Vital Signs BP 107/77 (BP Location: Right Arm)   Pulse 89   Temp 98.3 F (36.8 C) (Oral)   Resp 15  SpO2 100%   Physical Exam Vitals and nursing note reviewed.  Constitutional:      General: She is not in acute distress.    Comments: Not in extremis however appears to generally feel unwell  HENT:     Head: Normocephalic and atraumatic.  Eyes:     Conjunctiva/sclera: Conjunctivae normal.  Cardiovascular:     Rate and Rhythm: Normal rate and regular rhythm.     Pulses:          Radial pulses are 2+ on the right side and 2+ on the left side.       Dorsalis pedis pulses are 2+ on the right side and 2+ on the left side.       Posterior tibial pulses are 2+ on the right side and 2+ on the left side.     Heart sounds: Normal heart sounds. No murmur heard.   Pulmonary:     Effort: Pulmonary effort is  normal. No accessory muscle usage or respiratory distress.     Breath sounds: Normal breath sounds.     Comments: Normal work of breathing Chest:     Chest wall: Tenderness (Palpation over anterior sternocostal joints recreates/exacerbates her reported pain.) present.  Musculoskeletal:     Cervical back: Normal range of motion and neck supple.     Right lower leg: No tenderness. No edema.     Left lower leg: No tenderness. No edema.  Skin:    General: Skin is warm.  Neurological:     General: No focal deficit present.     Mental Status: She is alert.     Comments: Awake and alert, answers all questions appropriately.  Speech is not slurred.  Psychiatric:        Mood and Affect: Mood normal.        Behavior: Behavior normal.     ED Results / Procedures / Treatments   Labs (all labs ordered are listed, but only abnormal results are displayed) Labs Reviewed  SARS CORONAVIRUS 2 BY RT PCR (HOSPITAL ORDER, PERFORMED IN [place] [facility]) - Abnormal; Notable for the following components:      Result Value   SARS Coronavirus 2 POSITIVE (*)    All other components within normal limits  BASIC METABOLIC PANEL - Abnormal; Notable for the following components:   BUN 5 (*)    All other components within normal limits  CBC  I-STAT BETA HCG BLOOD, ED ([facility], [facility], [facility] ONLY)  TROPONIN I (HIGH SENSITIVITY)    EKG EKG Interpretation  Date/Time:  Friday June 10 2020 09:25:26 EDT Ventricular Rate:  96 PR Interval:  152 QRS Duration: 72 QT Interval:  320 QTC Calculation: 404 R Axis:   80 Text Interpretation: Normal sinus rhythm Right atrial enlargement Low voltage QRS Borderline ECG Confirmed by Marianna Fuss ([id number]) on 06/10/2020 5:34:09 PM   Radiology DG Chest 2 View  Result Date: 06/10/2020 CLINICAL DATA:  Chest pain. EXAM: CHEST - 2 VIEW COMPARISON:  No prior. FINDINGS: Mediastinum hilar structures normal. Lungs are clear. No pleural effusion or pneumothorax. Heart size  normal. Degenerative change scratched it mild scoliosis thoracic spine. IMPRESSION: No acute cardiopulmonary disease. Electronically Signed   By: Maisie Fus  Register   On: 06/10/2020 09:44    Procedures Procedures (including critical care time)  Medications Ordered in ED Medications - No data to display  ED Course  I have reviewed the triage vital signs and the nursing notes.  Pertinent labs & imaging results that were  available during my care of the patient were reviewed by me and considered in my medical decision making (see chart for details).  Clinical Course as of Jun 10 2013  Fri Jun 10, 2020  1729 55, 150   [EH]  1742 Per lab, her test is positive.   SARS Coronavirus 2 by RT PCR (hospital order, performed in [facility] hospital lab) Nasopharyngeal Nasopharyngeal Swab [EH]  1744 [phone]   [EH]    Clinical Course User Index [EH] Norman Clay   MDM Rules/Calculators/A&P                         Lynn Silva was evaluated in Emergency Department on 06/10/2020 for the symptoms described in the history of present illness. She was evaluated in the context of the global COVID-19 pandemic, which necessitated consideration that the patient might be at risk for infection with the SARS-CoV-2 virus that causes COVID-19. Institutional protocols and algorithms that pertain to the evaluation of patients at risk for COVID-19 are in a state of rapid change based on information released by regulatory bodies including the CDC and federal and state organizations. These policies and algorithms were followed during the patient's care in the ED.  Patient presents today for evaluation of left-sided chest pain.  She is not vaccinated against Covid on her significant other tested positive.  She has multiple symptoms including headache, fatigue, myalgias/arthralgias and cough that all sound consistent with Covid.  Here Covid test is positive.  She had troponin obtained in triage which  was negative.  Chest x-ray without consolidations or other abnormalities.  Here she is afebrile, not tachycardic or tachypneic and at 100% on room air.  Patient reports that she is 5 feet 5 inches, and weighs 150 pounds, this puts her BMI right at 25, her information including chart, this information on her BMI, and her best phone number is forwarded to the [facility].  Pregnancy test is negative.  CBC and BMP are unremarkable.    Patient does not have any leg swelling, doubt PE at this time.  Patient is counseled on supportive care for COVID-19, including return precautions.  She is instructed to quarantine and given a work note.  As patient is not hypoxic, hypotensive with reassuring labs and chest x-ray no indication for admission at this time.  Return precautions were discussed with patient who states their understanding.  At the time of discharge patient denied any unaddressed complaints or concerns.  Patient is agreeable for discharge home.  Note: Portions of this report may have been transcribed using voice recognition software. Every effort was made to ensure accuracy; however, inadvertent computerized transcription errors may be present   Final Clinical Impression(s) / ED Diagnoses Final diagnoses:  COVID-19    Rx / DC Orders ED Discharge Orders    None       Norman Clay 06/10/20 2014    Milagros Loll, MD 06/10/20 2235

## 2020-06-10 NOTE — ED Notes (Signed)
Pt discharge instructions reviewed with the patient. The patient verbalized understanding of instructions. Pt discharged. 

## 2020-06-10 NOTE — ED Triage Notes (Signed)
To ED for eval of pinpoint left cp, located under left breast. States she woke with this pain 2 days ago. No nausea or vomiting. No injury. Appears in nad.

## 2020-06-10 NOTE — Discharge Instructions (Signed)
Please take Tylenol (acetaminophen) to relieve your pain.  You may take tylenol, up to 1,000 mg (two extra strength pills).  Do not take more than 3,000 mg tylenol in a 24 hour period.  Please check all medication labels as many medications such as pain and cold medications may contain tylenol. Please do not drink alcohol while taking this medication.   With Covid it is normal to have body aches, headaches, nausea, diarrhea, fatigue, cough, and generally feel unwell for at least 10 days.  Some people feel better sooner.   If you develop significant shortness of breath, especially if you are unable to get up and walk to the bathroom/around the house due to shortness of breath, you notice your nails or lips are turning blue, are unable to tolerate water, or have any other concerns please seek additional medical care and evaluation.

## 2020-06-11 ENCOUNTER — Telehealth: Payer: Self-pay | Admitting: Unknown Physician Specialty

## 2020-06-11 NOTE — Telephone Encounter (Signed)
Called to Discuss with patient about Covid symptoms and the use of the monoclonal antibody infusion for those with mild to moderate Covid symptoms and at a high risk of hospitalization.     Pt is qualified for this infusion due to co-morbid conditions and/or a member of an at-risk group.     Patient Active Problem List   Diagnosis Date Noted  . Substance abuse (HCC) 10/23/2016  . Postpartum care following vaginal delivery 10/21/2016    Patient declines infusion at this time. Symptoms tier reviewed as well as criteria for ending isolation. Preventative practices reviewed. Patient verbalized understanding.    Patient advised to call back if he/she decides that he/she does want to get infusion. Callback number to the infusion center given. Patient advised to go to Urgent care or ED with severe symptoms.

## 2020-06-12 ENCOUNTER — Emergency Department: Payer: HRSA Program

## 2020-06-12 ENCOUNTER — Other Ambulatory Visit: Payer: Self-pay

## 2020-06-12 ENCOUNTER — Emergency Department
Admission: EM | Admit: 2020-06-12 | Discharge: 2020-06-12 | Disposition: A | Discharge status: Home / Self Care | Payer: HRSA Program | Attending: Emergency Medicine | Admitting: Emergency Medicine

## 2020-06-12 DIAGNOSIS — F17210 Nicotine dependence, cigarettes, uncomplicated: Secondary | ICD-10-CM | POA: Insufficient documentation

## 2020-06-12 DIAGNOSIS — U071 COVID-19: Secondary | ICD-10-CM | POA: Insufficient documentation

## 2020-06-12 DIAGNOSIS — Z79899 Other long term (current) drug therapy: Secondary | ICD-10-CM | POA: Diagnosis not present

## 2020-06-12 DIAGNOSIS — R519 Headache, unspecified: Secondary | ICD-10-CM | POA: Diagnosis present

## 2020-06-12 NOTE — Discharge Instructions (Addendum)
Continue to quarantine at home 14 days after your symptoms began. Continue with ibuprofen Tylenol as needed for fever, body aches, headache. Increase fluids. Do not smoke during this time as it will make it more difficult with respiratory virus. Contact people that you have been around to let them know that the they have been exposed to Covid. Return to the emergency department if any severe worsening of your symptoms such as shortness of breath. Also reconsider the infusion treatment and information about the infusion team was on your discharge papers.

## 2020-06-12 NOTE — ED Triage Notes (Signed)
Patient reports body aches and headache.  Reports diagnoses with COVID + 2 days ago.

## 2020-06-12 NOTE — ED Provider Notes (Signed)
[facility] Emergency Department Provider Note  ____________________________________________   None    (approximate)  I have reviewed the triage vital signs and the nursing notes.   HISTORY  Chief Complaint Fever and Headache   HPI Lynn Silva is a 34 y.o. female presents to the emergency department with continued body aches and headache. Patient was seen at [facility] 2 days ago at which time she was diagnosed with Covid. Patient is a 3 cigarette a day smoker for the last 3 years. She states some if she is ever smoking 1 day has been half pack. She denies any previous history of asthma or pneumonia. She reports that she was contacted by someone in [place] about injections for her Covid however she declined because she was afraid to take them. She rates her pain as 7 out of 10.      No past medical history on file.  Patient Active Problem List   Diagnosis Date Noted  . Substance abuse (HCC) 10/23/2016  . Postpartum care following vaginal delivery 10/21/2016    Past Surgical History:  Procedure Laterality Date  . INDUCED ABORTION      Prior to Admission medications   Medication Sig Start Date End Date Taking? Authorizing Provider  ibuprofen (ADVIL,MOTRIN) 600 MG tablet Take 1 tablet (600 mg total) by mouth every 6 (six) hours as needed for mild pain or moderate pain. 10/23/16   Farrel Conners, CNM  Prenat-FeFum-FePo-FA-Omega 3 (CONCEPT DHA) 53.5-38-1 MG CAPS Take 30 capsules by mouth daily. 10/23/16   Farrel Conners, CNM    Allergies Patient has no known allergies.  No family history on file.  Social History Social History   Tobacco Use  . Smoking status: Current Every Day Smoker    Packs/day: 0.50    Years: 15.00    Pack years: 7.50    Types: Cigarettes  . Smokeless tobacco: Never Used  Substance Use Topics  . Alcohol use: No  . Drug use: Yes    Types: Marijuana, Cocaine    Comment: marj "last night"; cocaine within  past two weeks per patient    Review of Systems Constitutional: Positive fever/chills Eyes: No visual changes. ENT: No sore throat. Cardiovascular: Denies chest pain. Respiratory: Denies shortness of breath. Positive for cough. Gastrointestinal: No abdominal pain.  No nausea, no vomiting.  No diarrhea.  No constipation. Musculoskeletal: Other for body aches. Skin: Negative for rash. Neurological: Positive for headaches. Negative for focal weakness or numbness.  ____________________________________________   PHYSICAL EXAM:  VITAL SIGNS: ED Triage Vitals  Enc Vitals Group     BP 06/12/20 0042 110/65     Pulse Rate 06/12/20 0042 69     Resp 06/12/20 0042 18     Temp 06/12/20 0042 98.3 F (36.8 C)     Temp Source 06/12/20 0042 Oral     SpO2 06/12/20 0042 97 %     Weight 06/12/20 0043 150 lb (68 kg)     Height 06/12/20 0043 5\' 5"  (1.651 m)     Head Circumference --      Peak Flow --      Pain Score 06/12/20 0043 7     Pain Loc --      Pain Edu? --      Excl. in [place]? --     Constitutional: Alert and oriented. Well appearing and in no acute distress.  Patient is able to talk in complete sentences without any noted shortness of breath.  Occasional cough.  Eyes: Conjunctivae are normal.  Head: Atraumatic. Neck: No stridor.   Cardiovascular: Normal rate, regular rhythm. Grossly normal heart sounds.  Good peripheral circulation. Respiratory: Normal respiratory effort.  No retractions. Lungs CTAB. Gastrointestinal: Soft and nontender. No distention.  Musculoskeletal: Moves upper and lower extremities without any difficulty.  No edema noted lower extremities. Neurologic:  Normal speech and language. No gross focal neurologic deficits are appreciated.  Skin:  Skin is warm, dry and intact. No rash noted. Psychiatric: Mood and affect are normal. Speech and behavior are normal.  ____________________________________________   LABS (all labs ordered are listed, but only abnormal  results are displayed)  Labs Reviewed - No data to display ____________________________________________  RADIOLOGY   Official radiology report(s): DG Chest Port 1 View  Result Date: 06/12/2020 CLINICAL DATA:  34 year old female currently positive for COVID-19 with body aches and headache. EXAM: PORTABLE CHEST 1 VIEW COMPARISON:  Prior chest x-ray 06/10/2020 FINDINGS: The lungs are clear and negative for focal airspace consolidation, pulmonary edema or suspicious pulmonary nodule. No pleural effusion or pneumothorax. Cardiac and mediastinal contours are within normal limits. No acute fracture or lytic or blastic osseous lesions. The visualized upper abdominal bowel gas pattern is unremarkable. IMPRESSION: Negative chest x-ray. Electronically Signed   By: Malachy Moan M.D.   On: 06/12/2020 08:27    ____________________________________________   PROCEDURES  Procedure(s) performed (including Critical Care):  Procedures   ____________________________________________   INITIAL IMPRESSION / ASSESSMENT AND PLAN / ED COURSE  As part of my medical decision making, I reviewed the following data within the electronic MEDICAL RECORD NUMBER Notes from prior ED visits and [place] Controlled Substance Database  34 year old female presents to the ED with complaint of body aches, chills and headache after being diagnosed with Covid 2 days ago at [facility] in [place].  Patient states that someone did call talking to her about injections but she is afraid to take them.  Chest x-ray did not show pneumonia and patient's O2 sat was 99%.  Shortness of breath or difficulty breathing was noted during patient's visit.  She was sleeping prior to discharge.  We discussed Tylenol ibuprofen for headache and body aches.  She states she will reconsider taking the infusion treatment for Covid.  She is encouraged to return to the emergency department should she become worse and also encouraged to discontinue smoking at  this time.  ____________________________________________   FINAL CLINICAL IMPRESSION(S) / ED DIAGNOSES  Final diagnoses:  COVID-19 virus infection     ED Discharge Orders    None      *Please note:  Lynn Silva was evaluated in Emergency Department on 06/12/2020 for the symptoms described in the history of present illness. She was evaluated in the context of the global COVID-19 pandemic, which necessitated consideration that the patient might be at risk for infection with the SARS-CoV-2 virus that causes COVID-19. Institutional protocols and algorithms that pertain to the evaluation of patients at risk for COVID-19 are in a state of rapid change based on information released by regulatory bodies including the CDC and federal and state organizations. These policies and algorithms were followed during the patient's care in the ED.  Some ED evaluations and interventions may be delayed as a result of limited staffing during and the pandemic.*   Note:  This document was prepared using Dragon voice recognition software and may include unintentional dictation errors.    Tommi Rumps, PA-C 06/12/20 1044    Dionne Bucy, MD 06/12/20 1120

## 2021-08-14 IMAGING — CR DG CHEST 2V
2 series · 2 of 2 positions shown · non-contrast
Comparison: No prior.

CLINICAL DATA: Chest pain.

EXAM:
CHEST - 2 VIEW

[chest pa]
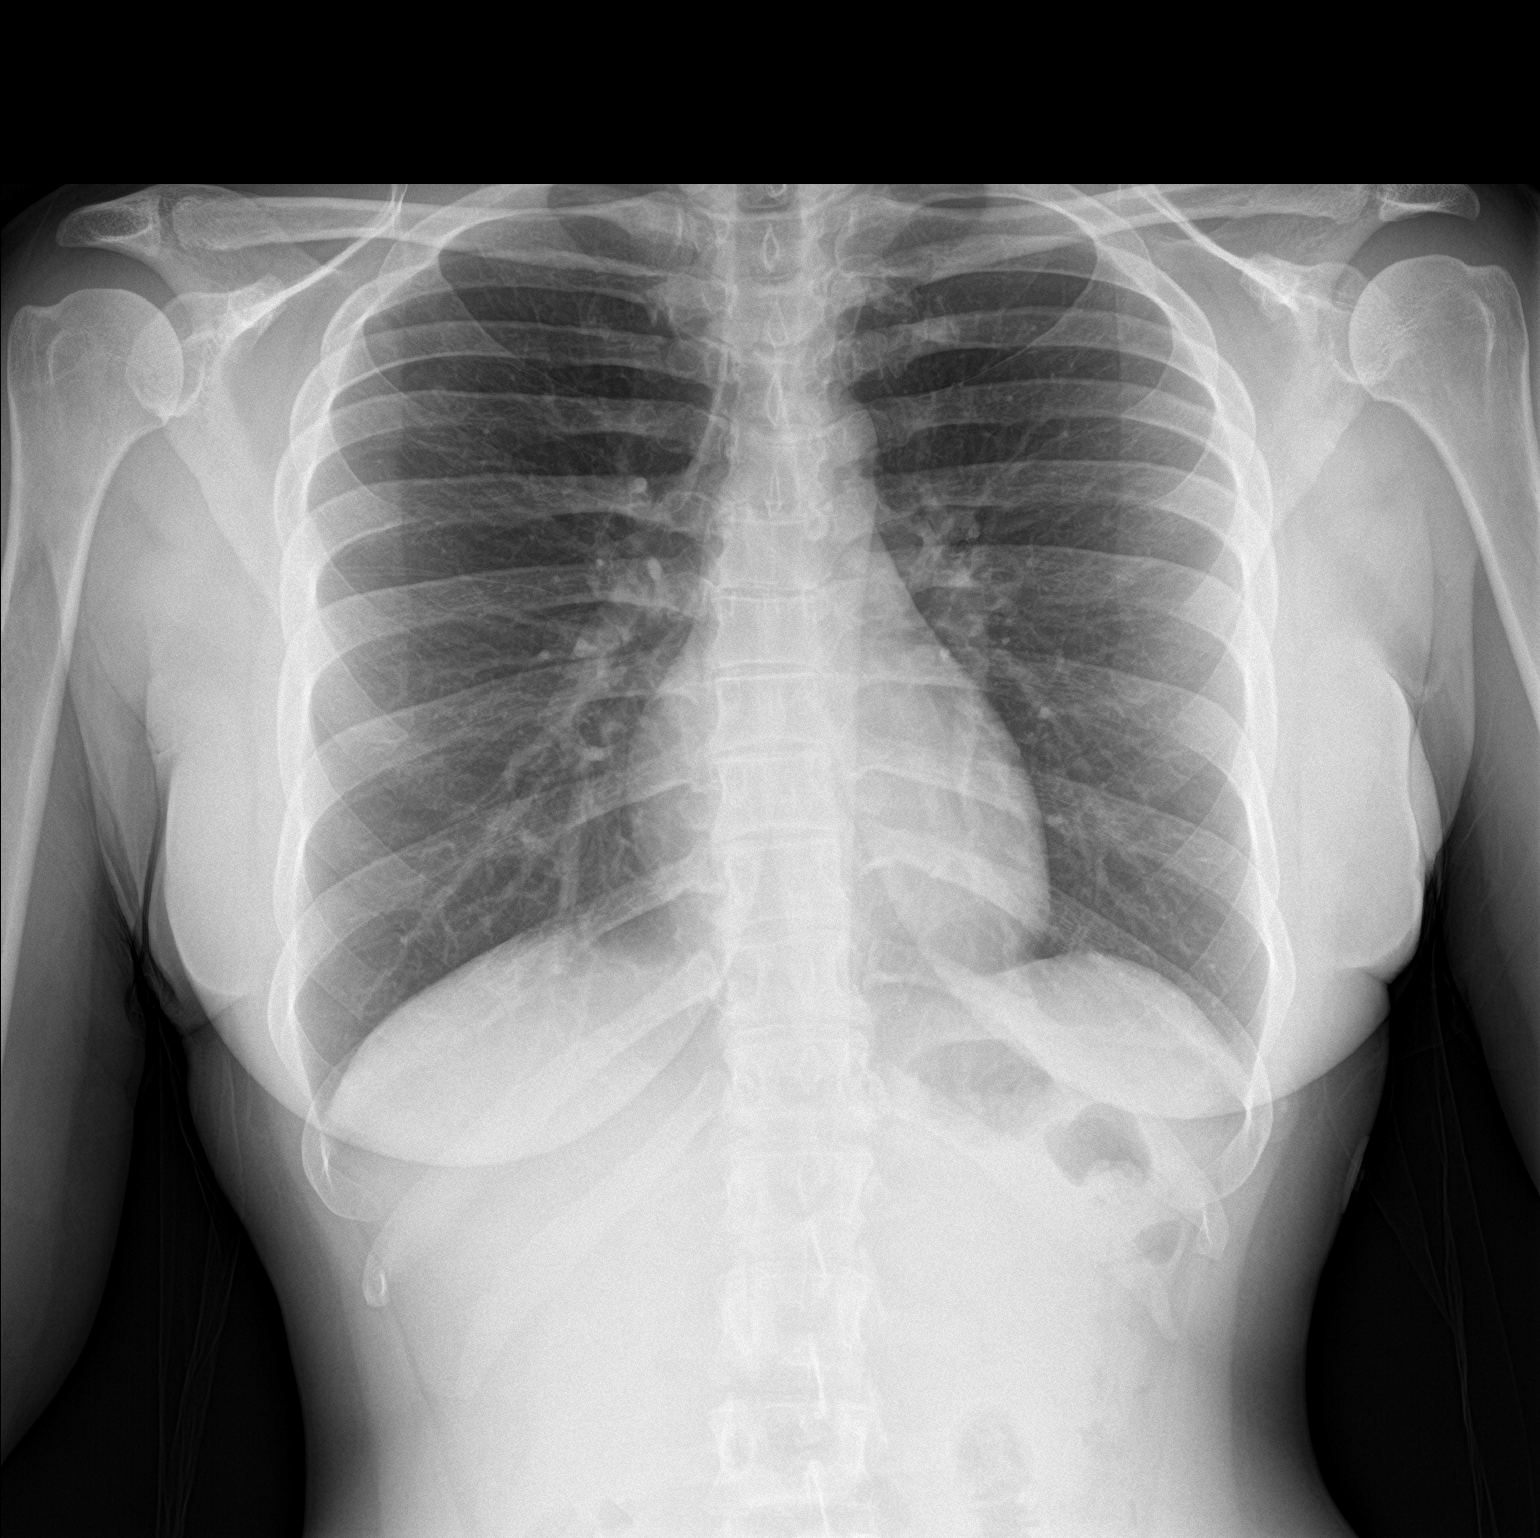

[chest lat]
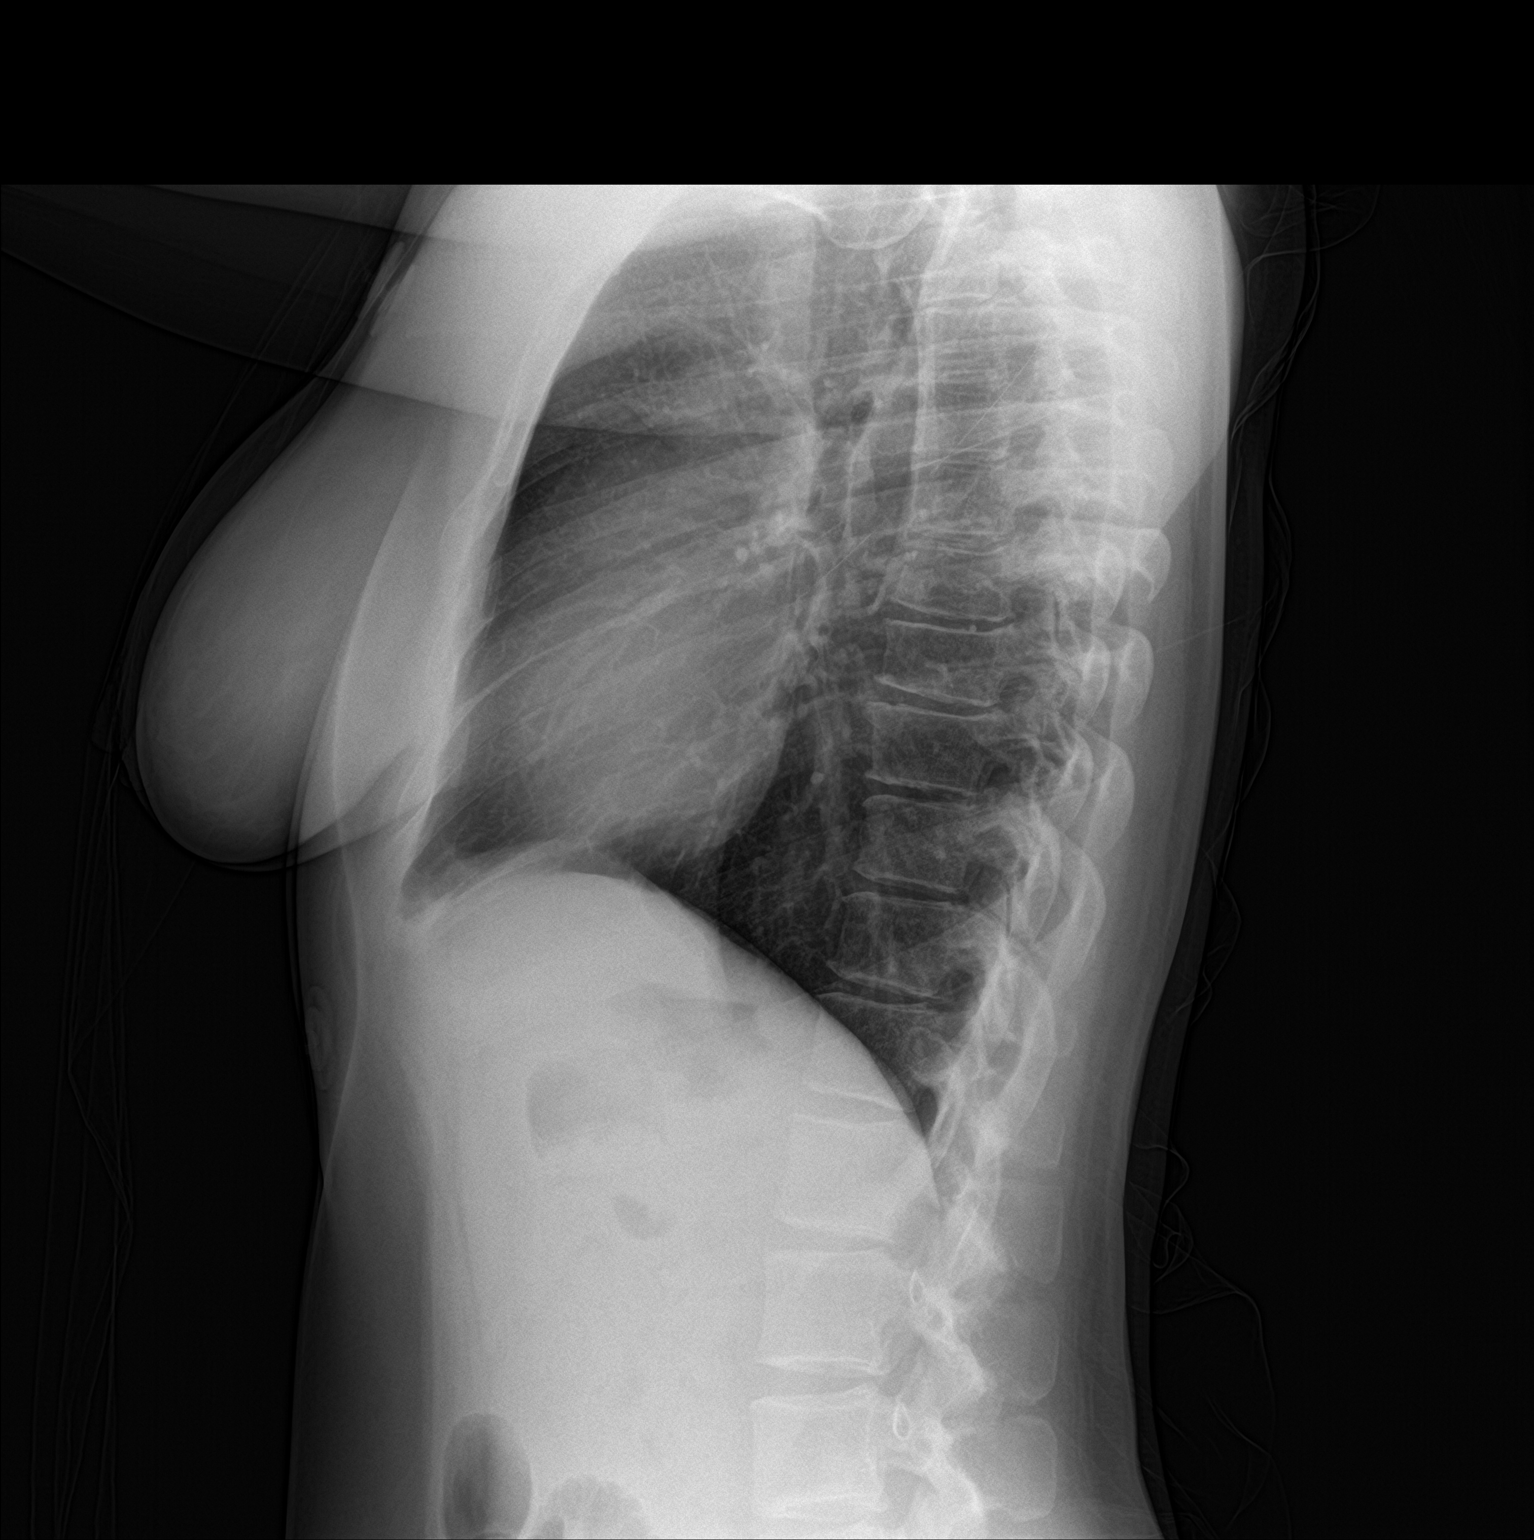

[2 of 2 positions shown; findings below may reference images not displayed]

FINDINGS: Mediastinum hilar structures normal. Lungs are clear. No pleural
effusion or pneumothorax. Heart size normal. Degenerative change
scratched it mild scoliosis thoracic spine.
IMPRESSION: No acute cardiopulmonary disease.

## 2021-08-16 IMAGING — DX DG CHEST 1V PORT
1 series · 1 of 1 positions shown · non-contrast
Comparison: Prior chest x-ray 06/10/2020

CLINICAL DATA: 33-year-old female currently positive for [id number]
with body aches and headache.

EXAM:
PORTABLE CHEST 1 VIEW

[chest ap]
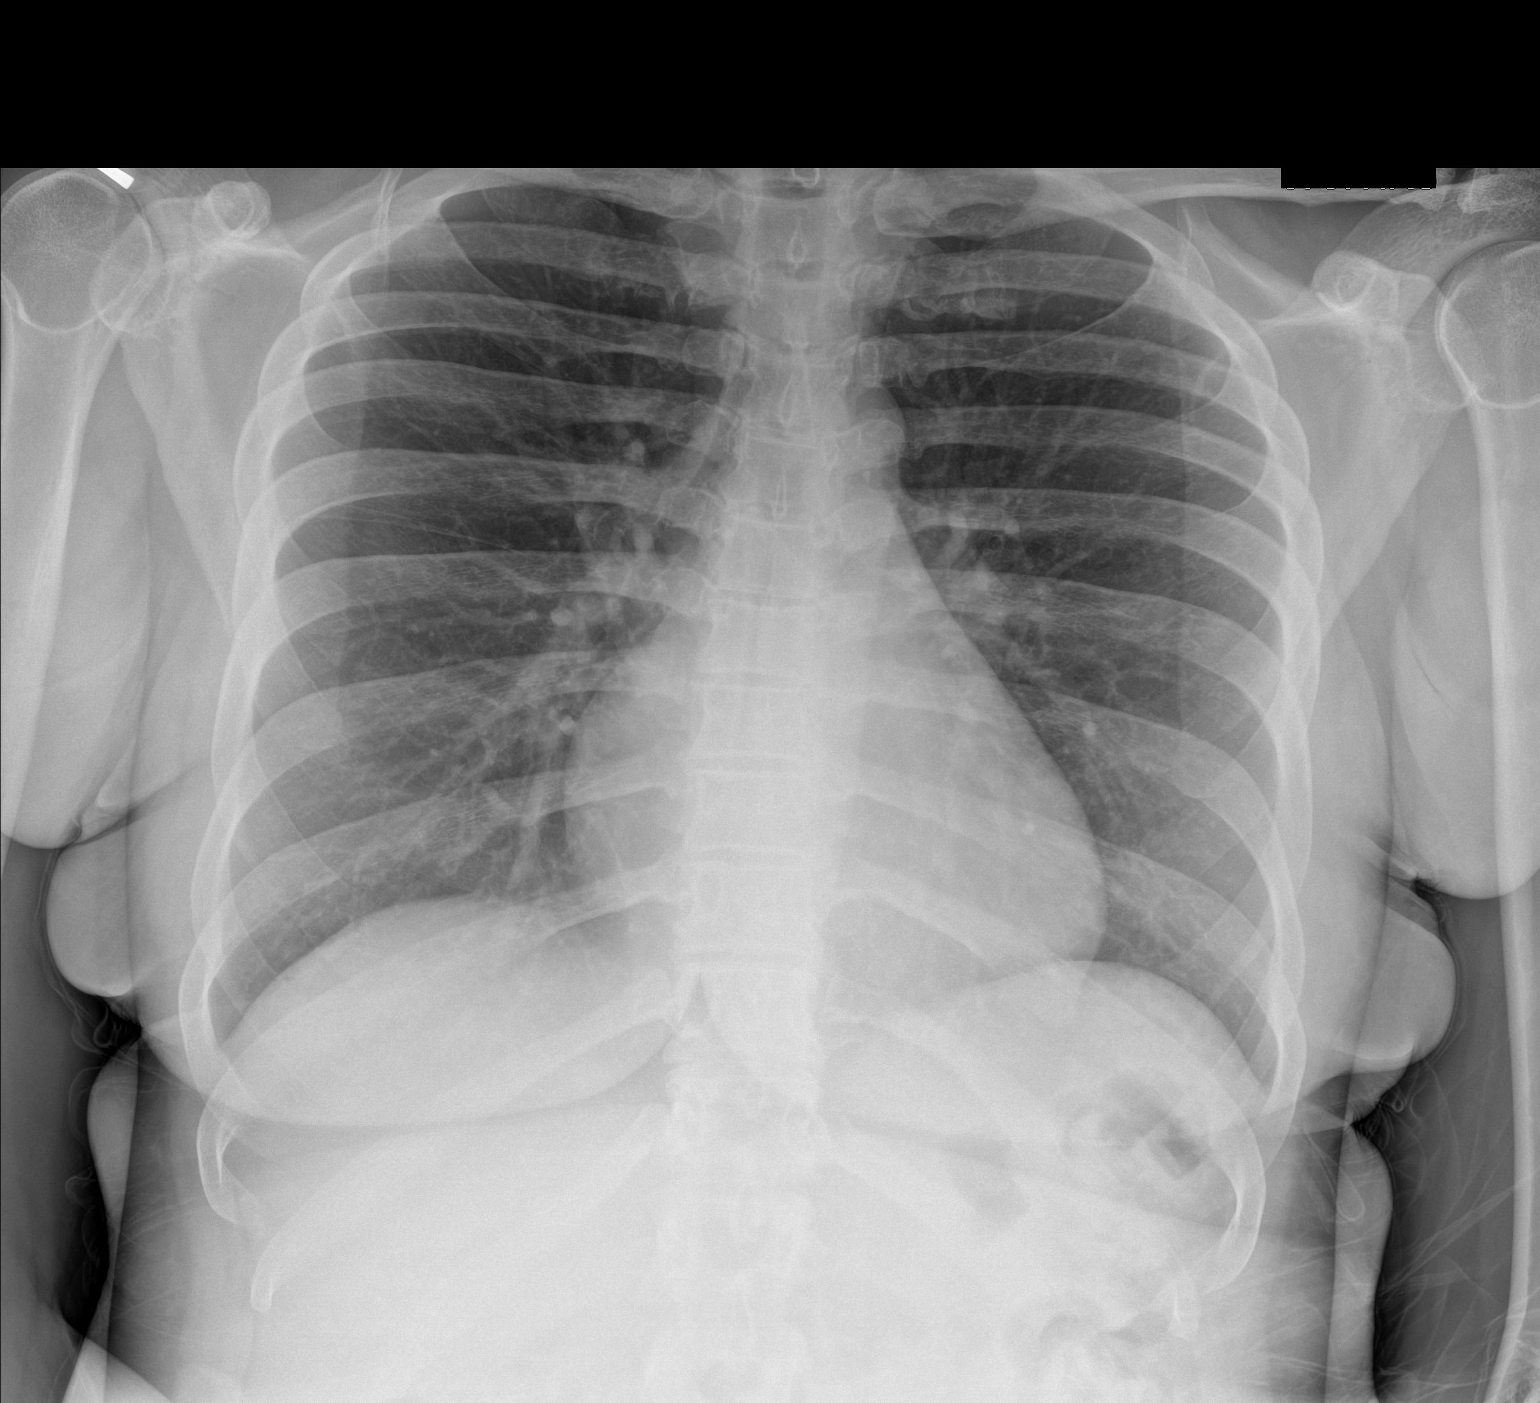

[1 of 1 positions shown; findings below may reference images not displayed]

FINDINGS: The lungs are clear and negative for focal airspace consolidation,
pulmonary edema or suspicious pulmonary nodule. No pleural effusion
or pneumothorax. Cardiac and mediastinal contours are within normal
limits. No acute fracture or lytic or blastic osseous lesions. The
visualized upper abdominal bowel gas pattern is unremarkable.
IMPRESSION: Negative chest x-ray.

## 2022-03-17 ENCOUNTER — Other Ambulatory Visit: Payer: Self-pay

## 2022-03-17 ENCOUNTER — Encounter: Payer: Self-pay | Admitting: Emergency Medicine

## 2022-03-17 ENCOUNTER — Emergency Department
Admission: EM | Admit: 2022-03-17 | Discharge: 2022-03-17 | Disposition: A | Discharge status: Home / Self Care | Payer: Self-pay | Attending: Emergency Medicine | Admitting: Emergency Medicine

## 2022-03-17 DIAGNOSIS — R21 Rash and other nonspecific skin eruption: Secondary | ICD-10-CM | POA: Insufficient documentation

## 2022-03-17 LAB — POC URINE PREG, ED: Preg Test, Ur: NEGATIVE

## 2022-03-17 MED ORDER — BACITRACIN 500 UNIT/GM EX OINT: 1.0000 | TOPICAL_OINTMENT | Freq: Two times a day (BID) | CUTANEOUS | 0 refills | Status: DC

## 2022-03-17 MED ORDER — BACITRACIN ZINC 500 UNIT/GM EX OINT
TOPICAL_OINTMENT | CUTANEOUS | Status: AC
  Filled 2022-03-17: qty 3.6

## 2022-03-17 MED ORDER — VALACYCLOVIR HCL 1 G PO TABS: 1000.0000 mg | ORAL_TABLET | Freq: Two times a day (BID) | ORAL | 0 refills | Status: AC

## 2022-03-17 MED ORDER — DEXAMETHASONE 4 MG PO TABS
10.0000 mg | ORAL_TABLET | Freq: Once | ORAL | Status: AC
  Administered 2022-03-17: 10 mg via ORAL
  Filled 2022-03-17: qty 3

## 2022-03-17 MED ORDER — NAPROXEN 500 MG PO TABS
500.0000 mg | ORAL_TABLET | Freq: Once | ORAL | Status: AC
  Administered 2022-03-17: 500 mg via ORAL
  Filled 2022-03-17: qty 1

## 2022-03-17 MED ORDER — LORATADINE 10 MG PO TABS
10.0000 mg | ORAL_TABLET | Freq: Once | ORAL | Status: AC
  Administered 2022-03-17: 10 mg via ORAL
  Filled 2022-03-17: qty 1

## 2022-03-17 MED ORDER — ACETAMINOPHEN 500 MG PO TABS
1000.0000 mg | ORAL_TABLET | Freq: Once | ORAL | Status: AC
  Administered 2022-03-17: 1000 mg via ORAL
  Filled 2022-03-17: qty 2

## 2022-03-17 NOTE — ED Triage Notes (Signed)
Pt via POV from home. Pt c/o facial swelling and pain around her nose and mouth. Pt state she thinks its from her cocaine use, last use was 2 nights ago. Pt is A&Ox4 and NAD

## 2022-03-17 NOTE — ED Provider Notes (Signed)
[facility] Provider Note    Event Date/Time   First MD Initiated Contact with Patient 03/17/22 1119     (approximate)   History   Rash and Oral Swelling   HPI  Lynn Silva is a 36 y.o. female with a past medical history of previous cold sores, daily marijuana use and regular cocaine use most recently having used about 2 days ago via intranasal insufflation presents for evaluation of painful red itchy rash has little bit of blistering under the left nare extending to the lip.    History reviewed. No pertinent past medical history.   Physical Exam  Triage Vital Signs: ED Triage Vitals  Enc Vitals Group     BP 03/17/22 1120 99/73     Pulse Rate 03/17/22 1120 77     Resp 03/17/22 1120 18     Temp 03/17/22 1120 98.6 F (37 C)     Temp Source 03/17/22 1120 Oral     SpO2 03/17/22 1120 98 %     Weight 03/17/22 1114 151 lb (68.5 kg)     Height 03/17/22 1114 5\' 6"  (1.676 m)     Head Circumference --      Peak Flow --      Pain Score 03/17/22 1114 9     Pain Loc --      Pain Edu? --      Excl. in [place]? --     Most recent vital signs: Vitals:   03/17/22 1120  BP: 99/73  Pulse: 77  Resp: 18  Temp: 98.6 F (37 C)  SpO2: 98%    General: Awake, no distress.  CV:  Good peripheral perfusion.  Resp:  Normal effort.  Abd:  No distention.  Other:  Patient has little bit of swelling of her upper lip and some erythema and a little bit of induration extending up into the left nare.  Oropharynx is unremarkable.  Ears are unremarkable.  Cranial nerves II through XII are grossly intact.  There is no purulent drainage or any surrounding fluctuance.  Area of erythema is somewhat maculopapular and not clear pustules.   ED Results / Procedures / Treatments  Labs (all labs ordered are listed, but only abnormal results are displayed) Labs Reviewed  POC URINE PREG, ED     EKG   RADIOLOGY   PROCEDURES:  Critical Care performed:  No  Procedures   MEDICATIONS ORDERED IN ED: Medications  acetaminophen (TYLENOL) tablet 1,000 mg (1,000 mg Oral Given 03/17/22 1150)  loratadine (CLARITIN) tablet 10 mg (10 mg Oral Given 03/17/22 1150)  dexamethasone (DECADRON) tablet 10 mg (10 mg Oral Given 03/17/22 1151)  bacitracin ointment ( Topical Given 03/17/22 1159)  naproxen (NAPROSYN) tablet 500 mg (500 mg Oral Given 03/17/22 1158)     IMPRESSION / MDM / ASSESSMENT AND PLAN / ED COURSE  I reviewed the triage vital signs and the nursing notes. Patient's presentation is most consistent with acute, uncomplicated illness.                               Differential diagnosis includes, but is not limited to contact dermatitis possibly from contaminant and cocaine with a lower suspicion for cellulitis given overall appearance of the rash at this time.  She does note that she has noticed a tingling sensation in the last day and given she has a history of cold sores i.e. herpes labialis I think it  is reasonable to start her on a course of valacyclovir as she may be experiencing very early outbreak given the stress of the dermatitis.  I do not think there is evidence of a super bacterial infection at this time I think is reasonable for her to keep the area protected and moist with bacitracin.  In emergency room we will give her one-time dose of the steroid as well as some Claritin as I think the primary rash is likely allergic in nature.  She has no evidence of anaphylaxis.  Discussed that if she experiences any new or worsening of symptoms or yellow drainage she was returned immediately to emergency room as this would be worrisome for developing a suprapectoral infection and require antibiotics.  Otherwise she can follow-up with her PCP.  Counseled extensively on cocaine use and recommendation for close outpatient PCP follow-up for counseling with regards to this.  She has no other acute concerns at this time.  She was also given some Tylenol  ibuprofen for the pain.  Discharged stable condition.  Strict precautions advised and discussed.        FINAL CLINICAL IMPRESSION(S) / ED DIAGNOSES   Final diagnoses:  Rash     Rx / DC Orders   ED Discharge Orders          Ordered    valACYclovir (VALTREX) 1000 MG tablet  2 times daily        03/17/22 1207    bacitracin 500 UNIT/GM ointment  2 times daily        03/17/22 1207             Note:  This document was prepared using Dragon voice recognition software and may include unintentional dictation errors.   Gilles Chiquito, MD 03/17/22 1212

## 2022-09-05 ENCOUNTER — Emergency Department
Admission: EM | Admit: 2022-09-05 | Discharge: 2022-09-05 | Disposition: A | Discharge status: Home / Self Care | Payer: Self-pay | Attending: Emergency Medicine | Admitting: Emergency Medicine

## 2022-09-05 DIAGNOSIS — L249 Irritant contact dermatitis, unspecified cause: Secondary | ICD-10-CM | POA: Insufficient documentation

## 2022-09-05 MED ORDER — DIPHENHYDRAMINE HCL 25 MG PO TABS: 25.0000 mg | ORAL_TABLET | Freq: Four times a day (QID) | ORAL | 0 refills | Status: AC

## 2022-09-05 MED ORDER — PREDNISONE 20 MG PO TABS: 40.0000 mg | ORAL_TABLET | Freq: Every day | ORAL | 0 refills | Status: AC

## 2022-09-05 MED ORDER — DIPHENHYDRAMINE HCL 25 MG PO CAPS
25.0000 mg | ORAL_CAPSULE | Freq: Once | ORAL | Status: AC
  Administered 2022-09-05: 25 mg via ORAL
  Filled 2022-09-05: qty 1

## 2022-09-05 MED ORDER — FAMOTIDINE 20 MG PO TABS
40.0000 mg | ORAL_TABLET | Freq: Once | ORAL | Status: AC
  Administered 2022-09-05: 40 mg via ORAL
  Filled 2022-09-05: qty 2

## 2022-09-05 MED ORDER — PREDNISONE 20 MG PO TABS
60.0000 mg | ORAL_TABLET | Freq: Once | ORAL | Status: AC
  Administered 2022-09-05: 60 mg via ORAL
  Filled 2022-09-05: qty 3

## 2022-09-05 NOTE — ED Triage Notes (Signed)
  Patient presents with red bumps over her entire body that began 3 days ago; States that she began using Tide pods on Sunday when this all began; Patient has patent airway and does not appear in any distress

## 2022-09-05 NOTE — ED Provider Notes (Signed)
[facility] Provider Note    Event Date/Time   First MD Initiated Contact with Patient 09/05/22 1557     (approximate)   History   Allergic Reaction (Patient presents with red bumps over her entire body that began 3 days ago; States that she began using Tide pods on Sunday when this all began; Patient has patent airway and does not appear in any distress)   HPI  Lynn Silva is a 36 y.o. female who complains of a diffuse itchy rash all over her body, particularly axillary, upper abdomen, thighs, forearms, back.  No shortness of breath or chest pain or lightheadedness.  No difficulty swallowing or breathing.  This started 3 days ago.  Around that time, she had started using cocoa butter as a moisturizer and also obtained a new clothes washing detergent.     Physical Exam   Triage Vital Signs: ED Triage Vitals  Enc Vitals Group     BP 09/05/22 1518 (!) 112/92     Pulse Rate 09/05/22 1518 86     Resp 09/05/22 1518 17     Temp 09/05/22 1518 98.1 F (36.7 C)     Temp Source 09/05/22 1518 Oral     SpO2 09/05/22 1518 99 %     Weight 09/05/22 1509 159 lb 8 oz (72.3 kg)     Height 09/05/22 1509 5' 5.5" (1.664 m)     Head Circumference --      Peak Flow --      Pain Score --      Pain Loc --      Pain Edu? --      Excl. in [place]? --     Most recent vital signs: Vitals:   09/05/22 1518  BP: (!) 112/92  Pulse: 86  Resp: 17  Temp: 98.1 F (36.7 C)  SpO2: 99%    General: Awake, no distress.  CV:  Good peripheral perfusion.  Regular rate rhythm Resp:  Normal effort.  Clear to auscultation bilaterally Abd:  No distention.  Soft nontender Other:  Moist oral mucosa.  No oropharyngeal lesions or swelling.  No tongue elevation.  On the skin, there is a generalized fine papular rash without vesicles or surrounding erythema.  Not tender.  No drainage.  No induration.  It is concentrated in large clusters but generalized.   ED Results / Procedures /  Treatments   Labs (all labs ordered are listed, but only abnormal results are displayed) Labs Reviewed - No data to display   RADIOLOGY    PROCEDURES:  Procedures   MEDICATIONS ORDERED IN ED: Medications  predniSONE (DELTASONE) tablet 60 mg (has no administration in time range)  diphenhydrAMINE (BENADRYL) capsule 25 mg (has no administration in time range)  famotidine (PEPCID) tablet 40 mg (has no administration in time range)     IMPRESSION / MDM / ASSESSMENT AND PLAN / ED COURSE  I reviewed the triage vital signs and the nursing notes.                             Patient presents with clinically apparent contact dermatitis to cocoa butter or new detergent.  Doubt Stevens-Johnson, dress, zoster, anaphylaxis, infection       FINAL CLINICAL IMPRESSION(S) / ED DIAGNOSES   Final diagnoses:  Irritant contact dermatitis, unspecified trigger     Rx / DC Orders   ED Discharge Orders  Ordered    predniSONE (DELTASONE) 20 MG tablet  Daily with breakfast        09/05/22 1623    diphenhydrAMINE (BENADRYL) 25 MG tablet  Every 6 hours        09/05/22 1623             Note:  This document was prepared using Dragon voice recognition software and may include unintentional dictation errors.   Sharman Cheek, MD 09/05/22 [phone]

## 2022-12-11 ENCOUNTER — Other Ambulatory Visit: Payer: Self-pay

## 2022-12-11 DIAGNOSIS — K644 Residual hemorrhoidal skin tags: Secondary | ICD-10-CM | POA: Insufficient documentation

## 2022-12-11 NOTE — ED Triage Notes (Addendum)
Pt reports rectal pain intermittent "for awhile." Reports rectal bleeding 3 days ago. Denies active bleeding.States she feels something is out of her rectum. Pt reports pain with sitting, laying, walking. Pt reports scared to have bm. Pt denies trauma or insertion of foreign body. Pt ambulatory to triage. Alert and oriented following commands. Breathing unlabored with symmetric chest rise and fall.

## 2022-12-12 ENCOUNTER — Emergency Department
Admission: EM | Admit: 2022-12-12 | Discharge: 2022-12-12 | Disposition: A | Discharge status: Home / Self Care | Payer: Self-pay | Attending: Emergency Medicine | Admitting: Emergency Medicine

## 2022-12-12 DIAGNOSIS — K644 Residual hemorrhoidal skin tags: Secondary | ICD-10-CM

## 2022-12-12 MED ORDER — POLYETHYLENE GLYCOL 3350 17 G PO PACK: 17.0000 g | PACK | Freq: Every day | ORAL | 0 refills | Status: AC

## 2022-12-12 MED ORDER — HYDROCORTISONE ACETATE 25 MG RE SUPP: 25.0000 mg | Freq: Two times a day (BID) | RECTAL | 0 refills | Status: AC

## 2022-12-12 NOTE — Discharge Instructions (Addendum)
You can use the Anusol suppository for 7 days to help with irritation and pain related to your hemorrhoids.  Please start taking the MiraLAX to soften the stool.  Please also do sitz bath's which involves soaking your bottom in warm water about 3-4 times per day.

## 2022-12-12 NOTE — ED Provider Notes (Signed)
[facility] Provider Note    Event Date/Time   First MD Initiated Contact with Patient 12/12/22 0037     (approximate)   History   Rectal Pain   HPI  Lynn Silva is a 37 y.o. female with a history of as medical history presents with rectal pain.  Patient notes that she has had pain in the rectal area for a long time but it has been worse over the last week or so.  She occasionally has some bright red blood per rectum with wiping this has been going on for about a month.  Last had some bleeding about 2 days ago.  She does have pain with having a bowel movement and is concerned about having bowel movements.  She does not go every day does not take stool softeners.  She not aware of any history of hemorrhoids.  Denies trauma.     History reviewed. No pertinent past medical history.  Patient Active Problem List   Diagnosis Date Noted   Substance abuse ([place]) 10/23/2016   Postpartum care following vaginal delivery 10/21/2016     Physical Exam  Triage Vital Signs: ED Triage Vitals [12/11/22 2144]  Enc Vitals Group     BP 118/83     Pulse Rate 99     Resp 18     Temp 98 F (36.7 C)     Temp Source Oral     SpO2 97 %     Weight 150 lb (68 kg)     Height '5\' 5"'$  (1.651 m)     Head Circumference      Peak Flow      Pain Score 9     Pain Loc      Pain Edu?      Excl. in [place]?     Most recent vital signs: Vitals:   12/12/22 0140 12/12/22 0141  BP: 122/79   Pulse: (!) 59 (!) 59  Resp: 18   Temp:    SpO2: 99% 99%     General: Awake, no distress.  CV:  Good peripheral perfusion.  Resp:  Normal effort.  Abd:  No distention.  Neuro:             Awake, Alert, Oriented x 3  Other:  External hemorrhoids which are not thrombosed, no significant erythema, no fluctuance, no obvious anal fissure No fluctuance on internal rectal exam no active bleeding   ED Results / Procedures / Treatments  Labs (all labs ordered are listed, but only abnormal  results are displayed) Labs Reviewed - No data to display   EKG     RADIOLOGY    PROCEDURES:  Critical Care performed: No  Procedures   MEDICATIONS ORDERED IN ED: Medications - No data to display   IMPRESSION / MDM / [place] / ED COURSE  I reviewed the triage vital signs and the nursing notes.                              Patient's presentation is most consistent with acute, uncomplicated illness.  Differential diagnosis includes, but is not limited to, external hemorrhoids, internal hemorrhoids, anal fissure, less likely perirectal or perianal abscess  Patient is a 37 year old female presents with rectal pain and bleeding.  Tells me that the pain has been going on for "a while" but seems to be worse over the last several days.  Has had intermittent bleeding  with wiping for about a month.  Denies fevers or chills.  Denies any trauma.  She is somewhat constipated not having bowel movements every day and has pain and fear of bowel movements.  On exam she overall looks well.  On rectal exam she does have several external hemorrhoids but they do not appear thrombosed there is no evidence of infection I do not see an obvious anal fissure.  Did perform digital rectal exam and she is somewhat uncomfortable with this but there is no exquisite tenderness and there is no fluctuance no active bleeding.  I suspect that her symptoms are related to the external hemorrhoids.  We discussed supportive care including steroid suppository, sitz bath's and stool softeners.  I prescribed Anusol suppository and MiraLAX.  Discussed follow-up with primary care.      FINAL CLINICAL IMPRESSION(S) / ED DIAGNOSES   Final diagnoses:  External hemorrhoid     Rx / DC Orders   ED Discharge Orders          Ordered    hydrocortisone (ANUSOL-HC) 25 MG suppository  Every 12 hours        12/12/22 0202    polyethylene glycol (MIRALAX) 17 g packet  Daily        12/12/22 0202              Note:  This document was prepared using Dragon voice recognition software and may include unintentional dictation errors.   Rada Hay, MD 12/12/22 [phone]

## 2022-12-18 ENCOUNTER — Emergency Department
Admission: EM | Admit: 2022-12-18 | Discharge: 2022-12-18 | Disposition: A | Discharge status: Home / Self Care | Payer: Self-pay | Attending: Emergency Medicine | Admitting: Emergency Medicine

## 2022-12-18 ENCOUNTER — Encounter: Payer: Self-pay | Admitting: *Deleted

## 2022-12-18 ENCOUNTER — Other Ambulatory Visit: Payer: Self-pay

## 2022-12-18 ENCOUNTER — Emergency Department: Payer: Self-pay

## 2022-12-18 DIAGNOSIS — K649 Unspecified hemorrhoids: Secondary | ICD-10-CM

## 2022-12-18 DIAGNOSIS — K644 Residual hemorrhoidal skin tags: Secondary | ICD-10-CM | POA: Insufficient documentation

## 2022-12-18 LAB — CBC
HCT: 42.1 % (ref 36.0–46.0)
Hemoglobin: 14.1 g/dL (ref 12.0–15.0)
MCH: 32.8 pg (ref 26.0–34.0)
MCHC: 33.5 g/dL (ref 30.0–36.0)
MCV: 97.9 fL (ref 80.0–100.0)
Platelets: 307 10*3/uL (ref 150–400)
RBC: 4.3 MIL/uL (ref 3.87–5.11)
RDW: 13.6 % (ref 11.5–15.5)
WBC: 13.4 10*3/uL — ABNORMAL HIGH (ref 4.0–10.5)
nRBC: 0 % (ref 0.0–0.2)

## 2022-12-18 LAB — URINALYSIS, ROUTINE W REFLEX MICROSCOPIC
Bilirubin Urine: NEGATIVE
Glucose, UA: NEGATIVE mg/dL
Ketones, ur: NEGATIVE mg/dL
Nitrite: NEGATIVE
Protein, ur: NEGATIVE mg/dL
Specific Gravity, Urine: 1.028 (ref 1.005–1.030)
pH: 6 (ref 5.0–8.0)

## 2022-12-18 LAB — COMPREHENSIVE METABOLIC PANEL
ALT: 17 U/L (ref 0–44)
AST: 19 U/L (ref 15–41)
Albumin: 3.6 g/dL (ref 3.5–5.0)
Alkaline Phosphatase: 67 U/L (ref 38–126)
Anion gap: 7 (ref 5–15)
BUN: 13 mg/dL (ref 6–20)
CO2: 23 mmol/L (ref 22–32)
Calcium: 8.5 mg/dL — ABNORMAL LOW (ref 8.9–10.3)
Chloride: 105 mmol/L (ref 98–111)
Creatinine, Ser: 0.81 mg/dL (ref 0.44–1.00)
GFR, Estimated: >60 mL/min (ref >60)
Glucose, Bld: 110 mg/dL — ABNORMAL HIGH (ref 70–99)
Potassium: 4.1 mmol/L (ref 3.5–5.1)
Sodium: 135 mmol/L (ref 135–145)
Total Bilirubin: 0.4 mg/dL (ref 0.3–1.2)
Total Protein: 7.4 g/dL (ref 6.5–8.1)

## 2022-12-18 LAB — SAMPLE TO BLOOD BANK

## 2022-12-18 LAB — POC URINE PREG, ED: Preg Test, Ur: NEGATIVE

## 2022-12-18 MED ORDER — LIDOCAINE 4 % EX CREA
TOPICAL_CREAM | Freq: Once | CUTANEOUS | Status: AC
  Filled 2022-12-18: qty 5

## 2022-12-18 MED ORDER — HYDROCODONE-ACETAMINOPHEN 5-325 MG PO TABS: 1.0000 | ORAL_TABLET | Freq: Four times a day (QID) | ORAL | 0 refills | Status: DC | PRN

## 2022-12-18 MED ORDER — HYDROCODONE-ACETAMINOPHEN 5-325 MG PO TABS
2.0000 | ORAL_TABLET | Freq: Once | ORAL | Status: AC
  Administered 2022-12-18: 2 via ORAL
  Filled 2022-12-18: qty 2

## 2022-12-18 MED ORDER — KETOROLAC TROMETHAMINE 30 MG/ML IJ SOLN
15.0000 mg | Freq: Once | INTRAMUSCULAR | Status: AC
  Administered 2022-12-18: 15 mg via INTRAVENOUS
  Filled 2022-12-18: qty 1

## 2022-12-18 MED ORDER — IOHEXOL 300 MG/ML  SOLN
100.0000 mL | Freq: Once | INTRAMUSCULAR | Status: AC | PRN
  Administered 2022-12-18: 100 mL via INTRAVENOUS

## 2022-12-18 NOTE — ED Provider Notes (Signed)
[facility] Provider Note    Event Date/Time   First MD Initiated Contact with Patient 12/18/22 1819     (approximate)   History   Hemorrhoids and Rectal Pain   HPI  Lynn Silva is a 37 y.o. female here for evaluation of pain around her rectum.  Patient reports for "some time" she has had pain around her rectum.  She was diagnosed few days ago with hemorrhoids.  She has been using suppositories with only mild relief.  Sometimes the pain is mild but she will get waves of pain right in the rectal area.  Worsen when she uses the bathroom, defecates.  No fevers or chills.  Seeing small amounts of bleeding with stooling.  No abdominal pain.  The pain is all located on her rectum, it seems to be right in the rectal region and denies pain on either side or in the gluteal cheeks  Denies pregnancy.     Physical Exam   Triage Vital Signs: ED Triage Vitals  Enc Vitals Group     BP 12/18/22 1703 106/81     Pulse Rate 12/18/22 1703 (!) 112     Resp 12/18/22 1703 19     Temp 12/18/22 1703 98.3 F (36.8 C)     Temp Source 12/18/22 1703 Oral     SpO2 12/18/22 1703 97 %     Weight 12/18/22 1711 150 lb (68 kg)     Height 12/18/22 1711 5\' 5"  (1.651 m)     Head Circumference --      Peak Flow --      Pain Score 12/18/22 1711 10     Pain Loc --      Pain Edu? --      Excl. in [place]? --     Most recent vital signs: Vitals:   12/18/22 1703  BP: 106/81  Pulse: (!) 112  Resp: 19  Temp: 98.3 F (36.8 C)  SpO2: 97%     General: Awake, appears in pain, preferring not to lay directly on her buttock.  Reports the waves of pain come and go in the rectal area, currently having an episode. CV:  Good peripheral perfusion.  Resp:  Normal effort.  Abd:  No distention.  Abdomen is soft nontender nondistended.  Rectal exam escorted by tech Beverlee Nims.  Patient has multiple external hemorrhoids, they are quite tender to palpation but not clearly acutely thrombosed.  She  also reports tenderness to examination of the rectal introitus.  There is no surrounding erythema of the perineum.  No obvious area of particular fluctuance or mass to suggest rectal abscess on exam but she does report moderate to severe tenderness in the area and digital rectal exam seems to cause rectal spasm with increased pain. Other:     ED Results / Procedures / Treatments   Labs (all labs ordered are listed, but only abnormal results are displayed) Labs Reviewed  COMPREHENSIVE METABOLIC PANEL - Abnormal; Notable for the following components:      Result Value   Glucose, Bld 110 (*)    Calcium 8.5 (*)    All other components within normal limits  CBC - Abnormal; Notable for the following components:   WBC 13.4 (*)    All other components within normal limits  URINALYSIS, ROUTINE W REFLEX MICROSCOPIC - Abnormal; Notable for the following components:   Color, Urine YELLOW (*)    APPearance HAZY (*)    Hgb urine dipstick SMALL (*)  Leukocytes,Ua TRACE (*)    Bacteria, UA RARE (*)    All other components within normal limits  POC URINE PREG, ED  SAMPLE TO BLOOD BANK     EKG     RADIOLOGY CT imaging of the rectal region ordered to evaluate for possible perirectal abscess or other causation of pain  On my gross interpretation I do not see evidence of acute gross intra-abdominal pathology.  Reviewed the radiologist interpretation as well  CT ABDOMEN PELVIS W CONTRAST  Result Date: 12/18/2022 CLINICAL DATA:  Lower GI bleed. EXAM: CT ABDOMEN AND PELVIS WITH CONTRAST TECHNIQUE: Multidetector CT imaging of the abdomen and pelvis was performed using the standard protocol following bolus administration of intravenous contrast. RADIATION DOSE REDUCTION: This exam was performed according to the departmental dose-optimization program which includes automated exposure control, adjustment of the mA and/or kV according to patient size and/or use of iterative reconstruction technique.  CONTRAST:  134mL OMNIPAQUE IOHEXOL 300 MG/ML  SOLN COMPARISON:  None Available. FINDINGS: Evaluation of this exam is limited due to respiratory motion artifact. Lower chest: The visualized lung bases are clear. No intra-abdominal free air or free fluid. Hepatobiliary: No focal liver abnormality is seen. No gallstones, gallbladder wall thickening, or biliary dilatation. Pancreas: Unremarkable. No pancreatic ductal dilatation or surrounding inflammatory changes. Spleen: Normal in size without focal abnormality. Adrenals/Urinary Tract: Adrenal glands are unremarkable. Kidneys are normal, without renal calculi, focal lesion, or hydronephrosis. Bladder is unremarkable. Stomach/Bowel: There is slight enhancement of the anal mucosa which may represent hemorrhoids. There is no bowel obstruction. The appendix is normal. Vascular/Lymphatic: The abdominal aorta and IVC are unremarkable. No portal venous gas. There is no adenopathy. Reproductive: The uterus is anteverted and grossly unremarkable. There is a 1.2 cm right ovarian corpus luteum. The left ovary is unremarkable. Other: Small fat containing umbilical hernia. Musculoskeletal: No acute or significant osseous findings. IMPRESSION: 1. Possible hemorrhoids. No bowel obstruction. Normal appendix. 2. A 1.2 cm right ovarian corpus luteum. Electronically Signed   By: Anner Crete M.D.   On: 12/18/2022 20:18     PROCEDURES:  Critical Care performed: No  Procedures   MEDICATIONS ORDERED IN ED: Medications  lidocaine (LMX) 4 % cream ( Topical Given 12/18/22 1939)  iohexol (OMNIPAQUE) 300 MG/ML solution 100 mL (100 mLs Intravenous Contrast Given 12/18/22 1952)  ketorolac (TORADOL) 30 MG/ML injection 15 mg (15 mg Intravenous Given 12/18/22 2118)  HYDROcodone-acetaminophen (NORCO/VICODIN) 5-325 MG per tablet 2 tablet (2 tablets Oral Given 12/18/22 2118)     IMPRESSION / MDM / ASSESSMENT AND PLAN / ED COURSE  I reviewed the triage vital signs and the nursing  notes.                              Differential diagnosis includes, but is not limited to, pain from external hemorrhoids, rectal spasms, exclude evidence of acute intra-abdominal causation including perirectal abscess etc.  She does not have surrounding skin breakdown or evidence of infection of the perineum.  She does seem to have a significant increase in pain and waves of pain that I suspect her rectal spasm brought on by her hemorrhoids.      Patient's presentation is most consistent with acute complicated illness / injury requiring diagnostic workup.  Labs interpreted as normal comprehensive metabolic panel.  Normal CBC with exception to mildly elevated white blood cell count.  Additionally negative pregnancy test.  No acute anemia.  Patient reports only mild  amount of blood noted and I suspect this is likely related to bleeding hemorrhoid.  ----------------------------------------- 10:27 PM on 12/18/2022 ----------------------------------------- Patient much more comfortable resting without distress at this time.  Fully alert and oriented hydrocodone and Toradol have helped immensely with pain and discomfort.  She is resting quite comfortably in no distress.  Discussed with patient and she will call general surgery for close follow-up tomorrow.  I will prescribe the patient a narcotic pain medicine due to their condition which I anticipate will cause at least moderate pain short term. I discussed with the patient safe use of narcotic pain medicines, and that they are not to drive, work in dangerous areas, or ever take more than prescribed (no more than 1 to 2 pills every 6 hours). We discussed that this is the type of medication that can be  overdosed on and the risks of this type of medicine. Patient is very agreeable to only use as prescribed and to never use more than prescribed.  Return precautions and treatment recommendations and follow-up discussed with the patient who is agreeable  with the plan.  Patient not driving this evening.  She is calling family to drive her home        FINAL CLINICAL IMPRESSION(S) / ED DIAGNOSES   Final diagnoses:  Hemorrhoids, unspecified hemorrhoid type     Rx / DC Orders   ED Discharge Orders          Ordered    HYDROcodone-acetaminophen (NORCO/VICODIN) 5-325 MG tablet  Every 6 hours PRN        12/18/22 2222             Note:  This document was prepared using Dragon voice recognition software and may include unintentional dictation errors.   Delman Kitten, MD 12/18/22 2228

## 2022-12-18 NOTE — ED Triage Notes (Signed)
Pt reports hemorrhoids and rectal pain.  Pt states hemorrhoids bleeding and pt looks uncomfortable.  Pain worse with sitting.  Pt alert  speech clear.  Pt taking meds without relief.

## 2022-12-18 NOTE — Discharge Instructions (Signed)
No driving today or within 8 hours of use of hydrocodone.  Please call our general surgery clinic tomorrow morning to set up close follow-up.  Return to the ER right away if you develop severe bleeding, feel weak, develop a fever, swelling, severe pain, or other concerns arise.

## 2022-12-31 ENCOUNTER — Ambulatory Visit: Payer: Self-pay | Admitting: Surgery

## 2023-01-07 ENCOUNTER — Encounter: Payer: Self-pay | Admitting: Surgery

## 2023-01-07 ENCOUNTER — Ambulatory Visit (INDEPENDENT_AMBULATORY_CARE_PROVIDER_SITE_OTHER): Payer: Self-pay | Admitting: Surgery

## 2023-01-07 VITALS — BP 114/75 | HR 76 | Temp 98.5°F | Ht 65.0 in | Wt 158.2 lb

## 2023-01-07 DIAGNOSIS — K649 Unspecified hemorrhoids: Secondary | ICD-10-CM

## 2023-01-07 DIAGNOSIS — K6289 Other specified diseases of anus and rectum: Secondary | ICD-10-CM

## 2023-01-07 NOTE — Patient Instructions (Addendum)
Your prescription for Nifedipine ointment has been called into [facility] in [place]. This is a compounded medication and they are the only pharmacy that does this. Insurance does not normally cover this medication and the cost is $45. Apply a small amount to your finger and place this just inside the rectum three times a day for 6 weeks.  The drug store will call you to let you know when this prescription is ready. [facility] is located at [address] [place], [place] [address] [facility]: [phone]  We would like for you to use Benefiber or Metamucil every day. You may use the generic version of these. Be sure to drink plenty of water so that the fiber will work better.  Follow up here in 3 weeks.   Hemorrhoids Hemorrhoids are swollen veins in and around the rectum or the opening of the butt (anus). There are two types of hemorrhoids: Internal. These occur in the veins just inside the rectum. They may poke through to the outside and become irritated and painful. External. These occur in the veins outside the anus. They can be felt as a painful swelling or hard lump near the anus. Most hemorrhoids do not cause severe problems. Often, they can be treated at home with diet and lifestyle changes. If home treatments do not help, you may need a procedure to shrink or remove the hemorrhoids. What are the causes? Hemorrhoids are caused by pressure near the anus. This pressure may be caused by: Constipation or diarrhea. Straining to poop. Pregnancy. Obesity. Sitting or riding a bike for a long time. Heavy lifting or other things that cause you to strain. Anal sex. What are the signs or symptoms? Symptoms of this condition include: Pain. Anal itching or irritation. Bleeding from the rectum. Leakage of poop (stool). Swelling of the anus. One or more lumps around the anus. How is this diagnosed? Hemorrhoids can often be diagnosed through a visual exam. Other exams or tests may also be done,  such as: A digital rectal exam. This is when your health care provider feels inside your rectum with a gloved finger. Anoscope. This is an exam of the anus using a small tube. A blood test, if you have lost a lot of blood. A sigmoidoscopy or colonoscopy. These are tests to look inside the colon using a tube with a camera on the end. How is this treated? In most cases, hemorrhoids can be treated at home with diet and lifestyle changes. If these changes do not help, you may need to have a procedure done. These procedures can make the hemorrhoids smaller or fully remove them. Common procedures include: Rubber band ligation. Rubber bands are placed at the base of the hemorrhoids to cut off their blood supply. Sclerotherapy. Medicine is put into the hemorrhoids to shrink them. Infrared coagulation. A type of light energy is used to get rid of the hemorrhoids. Hemorrhoidectomy surgery. The hemorrhoids are removed during surgery. Then, the veins that supply them are tied off. Stapled hemorrhoidopexy surgery. The base of the hemorrhoid is stapled to the wall of the rectum. Follow these instructions at home: Medicines Take over-the-counter and prescription medicines only as told by your provider. Use medicated creams or medicines that are put in the rectum (suppositories) as told by your provider. Eating and drinking  Eat foods that are high in fiber, such as beans, whole grains, and fresh fruits and vegetables. Ask your provider about taking products that have fiber added to them (fiber supplements). Reduce  the amount of fat in your diet. You can do this by eating low-fat dairy products, eating less red meat, and avoiding processed foods. Drink enough fluid to keep your pee (urine) pale yellow. Managing pain and swelling  Take warm sitz baths for 20 minutes, 3-4 times a day. This can help ease pain and discomfort. You may do this in a bathtub or you can use a portable sitz bath that fits over the  toilet. If told, put ice on the affected area. It may help to use ice packs between sitz baths. Put ice in a plastic bag. Place a towel between your skin and the bag. Leave the ice on for 20 minutes, 2-3 times a day. If your skin turns bright red, remove the ice right away to prevent skin damage. The risk of damage is higher if you cannot feel pain, heat, or cold. General instructions Exercise. Ask your provider how much and what kind of exercise is best for you. In general, you should do moderate exercise for at least 30 minutes on most days of the week (150 minutes each week). You may want to try walking, biking, or yoga. Go to the bathroom when you have the urge to poop. Do not wait. Avoid straining to poop. Keep the anus dry and clean. Use wet toilet paper or moist towelettes after you poop. Do not sit on the toilet for a long time. This can increase blood pooling and pain. Where to find more information [employer] and Digestive [employer] Kidney Diseases: [email or web address] Contact a health care provider if: You have more pain and swelling that do not get better with treatment. You have trouble pooping or you are not able to poop. You have pain or inflammation outside the area of the hemorrhoids. Get help right away if: You are bleeding from your rectum and you cannot get it to stop. This information is not intended to replace advice given to you by your health care provider. Make sure you discuss any questions you have with your health care provider. Document Revised: 05/30/2022 Document Reviewed: 05/30/2022 Elsevier Patient Education  2023 [employer].

## 2023-01-09 NOTE — Progress Notes (Unsigned)
Patient ID: Lynn Silva, female   DOB: 10-25-1985, 37 y.o.   MRN: [id number]  HPI Lynn Silva is a 37 y.o. female seen in consultation at the request of Dr. Paris Lore.  He reports anorectal pain for several weeks now.  Pain is intermittent moderate to severe intensity in the rectal area and sharp.  Worsening with bowel movements.  He has used suppositories and other creams without relief.  She had some blood in the stool. He already had a CT scan that I have personally reviewed she has a small umbilical hernia, there was no specific necrotizing infection or abscess on the CT scan. He did have an increase in the white count to 13,000 but the rest of the labs were completely normal.  Denies any fevers and chills or any other symptoms.  No abdominal pain HPI  History reviewed. No pertinent past medical history.  Past Surgical History:  Procedure Laterality Date   INDUCED ABORTION      History reviewed. No pertinent family history.  Social History Social History   Tobacco Use   Smoking status: Every Day    Packs/day: 0.50    Years: 15.00    Additional pack years: 0.00    Total pack years: 7.50    Types: Cigarettes   Smokeless tobacco: Never  Substance Use Topics   Alcohol use: Yes   Drug use: Yes    Types: Marijuana, Cocaine    Comment: marj "last night"; cocaine within past two weeks per patient    No Known Allergies  Current Outpatient Medications  Medication Sig Dispense Refill   diphenhydrAMINE (BENADRYL) 25 MG tablet Take 1 tablet (25 mg total) by mouth every 6 (six) hours for 3 days. 12 tablet 0   HYDROcodone-acetaminophen (NORCO/VICODIN) 5-325 MG tablet Take 1-2 tablets by mouth every 6 (six) hours as needed for moderate pain. 16 tablet 0   polyethylene glycol (MIRALAX) 17 g packet Take 17 g by mouth daily. 14 each 0   No current facility-administered medications for this visit.     Review of Systems Full ROS  was asked and was negative except for the  information on the HPI  Physical Exam Blood pressure 114/75, pulse 76, temperature 98.5 F (36.9 C), temperature source Oral, height 5\' 5"  (1.651 m), weight 158 lb 3.2 oz (71.8 kg), last menstrual period 11/27/2022, SpO2 100 %, unknown if currently breastfeeding. CONSTITUTIONAL: nad chaperone present. EYES: Pupils are equal, round,  Sclera are non-icteric. EARS, NOSE, MOUTH AND THROAT: The oral mucosa is pink and moist. Hearing is intact to voice. LYMPH NODES:  Lymph nodes in the neck are normal. RESPIRATORY:  Lungs are clear. There is normal respiratory effort, with equal breath sounds bilaterally, and without pathologic use of accessory muscles. CARDIOVASCULAR: Heart is regular without murmurs, gallops, or rubs. GI: The abdomen is  soft, nontender, and nondistended. There are no palpable masses. There is no hepatosplenomegaly. There are normal bowel sounds in all quadrants. Rectal: There is evidence of induration in the left side and is tender.  There is no evidence of abscess there is no evidence of necrotizing infection.  There is no blood and there is no masses MUSCULOSKELETAL: Normal muscle strength and tone. No cyanosis or edema.   SKIN: Turgor is good and there are no pathologic skin lesions or ulcers. NEUROLOGIC: Motor and sensation is grossly normal. Cranial nerves are grossly intact. PSYCH:  Oriented to person, place and time. Affect is normal.  Data Reviewed  I have personally  reviewed the patient's imaging, laboratory findings and medical records.    Assessment/Plan 37 year old female with anorectal pain.  No evidence of thrombosed hemorrhoid I am not certain 100% if she is got a fissure but I would like to treated as such.  She does have some induration in the anorectal area but does not have any abscess or necrotizing infection.  We may have to do further imaging studies for now I will like to do nifedipine cream sitz bath's and proper hygiene as well as fiber.  I will follow  her up in a few weeks and at that time if symptoms are noT better we will further correct arise her with a CT scan of the pelvis.  No need for emergent surgical intervention at this time I spent 45 minutes in this encounter including personally reviewing imaging studies, coordinating her care, placing orders and performing appropriate documentation.   [name], MD FACS General Surgeon 01/09/2023, 2:31 PM

## 2023-01-10 ENCOUNTER — Encounter: Payer: Self-pay | Admitting: Surgery

## 2023-01-22 ENCOUNTER — Other Ambulatory Visit: Payer: Self-pay

## 2023-01-22 ENCOUNTER — Emergency Department
Admission: EM | Admit: 2023-01-22 | Discharge: 2023-01-22 | Disposition: A | Discharge status: Home / Self Care | Payer: Self-pay | Attending: Emergency Medicine | Admitting: Emergency Medicine

## 2023-01-22 DIAGNOSIS — L232 Allergic contact dermatitis due to cosmetics: Secondary | ICD-10-CM | POA: Insufficient documentation

## 2023-01-22 MED ORDER — CETIRIZINE HCL 10 MG PO TABS
10.0000 mg | ORAL_TABLET | Freq: Once | ORAL | Status: AC
  Administered 2023-01-22: 10 mg via ORAL
  Filled 2023-01-22: qty 1

## 2023-01-22 MED ORDER — HYDROCORTISONE 1 % EX OINT: 1.0000 | TOPICAL_OINTMENT | Freq: Two times a day (BID) | CUTANEOUS | 0 refills | Status: AC

## 2023-01-22 MED ORDER — CETIRIZINE HCL 10 MG PO TABS: 10.0000 mg | ORAL_TABLET | Freq: Every day | ORAL | 11 refills | Status: AC

## 2023-01-22 NOTE — ED Provider Notes (Signed)
   [facility] Provider Note    Event Date/Time   First MD Initiated Contact with Patient 01/22/23 0448     (approximate)   History   Allergic Reaction   HPI  Lynn Silva is a 37 y.o. female   Past medical history of no significant past medical history presents with facial itching and swelling after applying a new soap earlier today.  No respiratory complaints, no rash elsewhere.      Physical Exam   Triage Vital Signs: ED Triage Vitals [01/22/23 0314]  Enc Vitals Group     BP 110/67     Pulse Rate 68     Resp 18     Temp (!) 97.5 F (36.4 C)     Temp Source Oral     SpO2 99 %     Weight 148 lb (67.1 kg)     Height  (1.651 m)     Head Circumference      Peak Flow      Pain Score      Pain Loc      Pain Edu?      Excl. in [place]?     Most recent vital signs: Vitals:   01/22/23 0314  BP: 110/67  Pulse: 68  Resp: 18  Temp: (!) 97.5 F (36.4 C)  SpO2: 99%    General: Awake, no distress.  CV:  Good peripheral perfusion.  Resp:  Normal effort.  Abd:  No distention.  Other:  Some mild swelling and raised lesions on the face, oropharynx appears normal neck supple full range of motion lungs clear looks comfortable   ED Results / Procedures / Treatments   Labs (all labs ordered are listed, but only abnormal results are displayed) Labs Reviewed - No data to display   PROCEDURES:  Critical Care performed: No  Procedures   MEDICATIONS ORDERED IN ED: Medications  cetirizine (ZYRTEC) tablet 10 mg (has no administration in time range)    IMPRESSION / MDM / ASSESSMENT AND PLAN / ED COURSE  I reviewed the triage vital signs and the nursing notes.                                Patient's presentation is most consistent with acute presentation with potential threat to life or bodily function.  Differential diagnosis includes, but is not limited to, allergic reaction, contact dermatitis, anaphylaxis    MDM: Most  consistent with contact dermatitis.  Patient appears well.  Known exposure I told her to avoid this exposure in the future.  I wrote her a prescription for steroid topical, as well as antihistamine.        FINAL CLINICAL IMPRESSION(S) / ED DIAGNOSES   Final diagnoses:  Allergic contact dermatitis due to cosmetics     Rx / DC Orders   ED Discharge Orders          Ordered    cetirizine (ZYRTEC ALLERGY) 10 MG tablet  Daily        01/22/23 0500    hydrocortisone 1 % ointment  2 times daily        01/22/23 0500             Note:  This document was prepared using Dragon voice recognition software and may include unintentional dictation errors.    Pilar Jarvis, MD 01/22/23 [phone]

## 2023-01-22 NOTE — Discharge Instructions (Signed)
Use the ointment on your face twice daily as prescribed. Take Zyrtec as needed for itching.

## 2023-01-22 NOTE — ED Notes (Signed)
Pt A&O x4, no obvious distress noted, respirations regular/unlabored. Pt verbalizes understanding of discharge instructions. Pt able to ambulate from ED independently.   

## 2023-01-22 NOTE — ED Triage Notes (Signed)
Ambulatory to triage with steady gait without use of assistive devices with c/o hives to face. Sx began yesterday at work. Unknown if she was exposed to any specific irritating substances.  Denies dyspnea or edema to throat at this time.

## 2023-01-23 ENCOUNTER — Ambulatory Visit: Payer: Self-pay | Admitting: Surgery

## 2023-06-13 ENCOUNTER — Emergency Department
Admission: EM | Admit: 2023-06-13 | Discharge: 2023-06-13 | Disposition: A | Discharge status: Home / Self Care | Payer: Self-pay | Attending: Emergency Medicine | Admitting: Emergency Medicine

## 2023-06-13 ENCOUNTER — Emergency Department: Payer: Self-pay

## 2023-06-13 ENCOUNTER — Encounter: Payer: Self-pay | Admitting: *Deleted

## 2023-06-13 ENCOUNTER — Other Ambulatory Visit: Payer: Self-pay

## 2023-06-13 DIAGNOSIS — J069 Acute upper respiratory infection, unspecified: Secondary | ICD-10-CM | POA: Insufficient documentation

## 2023-06-13 DIAGNOSIS — Z1152 Encounter for screening for COVID-19: Secondary | ICD-10-CM | POA: Insufficient documentation

## 2023-06-13 LAB — SARS CORONAVIRUS 2 BY RT PCR: SARS Coronavirus 2 by RT PCR: NEGATIVE

## 2023-06-13 MED ORDER — BENZONATATE 100 MG PO CAPS: ORAL_CAPSULE | ORAL | 0 refills | Status: AC

## 2023-06-13 NOTE — ED Notes (Signed)
Patient transported to X-ray 

## 2023-06-13 NOTE — ED Notes (Signed)
 Provided pt with discharge instructions and education. All of pt questions answered. Pt in possession of all belongings. Pt AAOX4 and stable at time of discharge.Pt ambulated w/ steady gait towards ED exit.

## 2023-06-13 NOTE — ED Triage Notes (Signed)
Pt reports a cough, congestion and weakness for 3 days.  No fever.  Pt alert.

## 2023-06-13 NOTE — Discharge Instructions (Addendum)
Your exam, chest x-ray, and COVID test are negative and reassuring at this time.  No signs of an acute pneumonia.  You should take OTC Delsym for cough relief.  Take the prescription Tessalon Perles in addition for cough relief.  Follow-up with your primary provider return to the ED if needed.

## 2023-06-13 NOTE — ED Provider Notes (Signed)
[facility] Emergency Department Provider Note     Event Date/Time   First MD Initiated Contact with Patient 06/13/23 2140     (approximate)   History   Cough   HPI  Lynn Silva is a 37 y.o. female with a noncontributory medical history, presents to the ED for evaluation of cough, congestion, generalized malaise weakness for the last 3 days.  Patient denies any fevers, chills, sweats, chest pain, shortness of breath.  She does endorse multiple sick contacts at her work place.  She notes a nonproductive cough and sinus congestion.  No bowel changes reported.  The patient is not taking any medications in the interim for symptom management.   Physical Exam   Triage Vital Signs: ED Triage Vitals  Encounter Vitals Group     BP 06/13/23 1934 113/70     Systolic BP Percentile --      Diastolic BP Percentile --      Pulse Rate 06/13/23 1934 72     Resp 06/13/23 1934 16     Temp 06/13/23 1934 98 F (36.7 C)     Temp src --      SpO2 06/13/23 1934 96 %     Weight --      Height 06/13/23 1936 5' (1.524 m)     Head Circumference --      Peak Flow --      Pain Score 06/13/23 1936 8     Pain Loc --      Pain Education --      Exclude from Growth Chart --     Most recent vital signs: Vitals:   06/13/23 1934  BP: 113/70  Pulse: 72  Resp: 16  Temp: 98 F (36.7 C)  SpO2: 96%    General Awake, no distress. NAD HEENT NCAT. PERRL. EOMI. No rhinorrhea. Mucous membranes are moist.  CV:  Good peripheral perfusion. RRR RESP:  Normal effort. CTA   ED Results / Procedures / Treatments   Labs (all labs ordered are listed, but only abnormal results are displayed) Labs Reviewed  SARS CORONAVIRUS 2 BY RT PCR    EKG   RADIOLOGY  I personally viewed and evaluated these images as part of my medical decision making, as well as reviewing the written report by the radiologist.  ED Provider Interpretation: No acute intrathoracic process is noted  on my evaluation of images.  No results found.   PROCEDURES:  Critical Care performed: No  Procedures   MEDICATIONS ORDERED IN ED: Medications - No data to display   IMPRESSION / MDM / ASSESSMENT AND PLAN / ED COURSE  I reviewed the triage vital signs and the nursing notes.                              Differential diagnosis includes, but is not limited to, COVID, flu, RSV, viral URI  Patient's presentation is most consistent with acute complicated illness / injury requiring diagnostic workup.  Patient's diagnosis is consistent with viral URI with cough.  No radiologic evidence of any acute intrathoracic infectious process based on interpretation.  Viral panel test for COVID is negative.  Patient will be discharged home with prescriptions for [facility]; and additionally is advised to take OTC Delsym. Patient is to follow up with her primary provider as discussed, as needed or otherwise directed. Patient is given ED precautions to return to the ED for any  worsening or new symptoms.   FINAL CLINICAL IMPRESSION(S) / ED DIAGNOSES   Final diagnoses:  Viral URI with cough     Rx / DC Orders   ED Discharge Orders          Ordered    benzonatate (TESSALON PERLES) 100 MG capsule        06/13/23 2226             Note:  This document was prepared using Dragon voice recognition software and may include unintentional dictation errors.    Lissa Hoard, PA-C 06/13/23 2228    Pilar Jarvis, MD 06/13/23 [phone]

## 2023-11-06 ENCOUNTER — Emergency Department: Payer: No Typology Code available for payment source

## 2023-11-06 ENCOUNTER — Other Ambulatory Visit: Payer: Self-pay

## 2023-11-06 ENCOUNTER — Emergency Department (HOSPITAL_COMMUNITY)
Admission: EM | Admit: 2023-11-06 | Discharge: 2023-11-06 | Discharge status: Against Medical Advice | Payer: No Typology Code available for payment source | Attending: Student | Admitting: Student

## 2023-11-06 ENCOUNTER — Encounter (HOSPITAL_COMMUNITY): Payer: Self-pay | Admitting: Emergency Medicine

## 2023-11-06 ENCOUNTER — Emergency Department
Admission: EM | Admit: 2023-11-06 | Discharge: 2023-11-06 | Disposition: A | Discharge status: Home / Self Care | Payer: No Typology Code available for payment source

## 2023-11-06 ENCOUNTER — Emergency Department (HOSPITAL_COMMUNITY): Payer: No Typology Code available for payment source

## 2023-11-06 DIAGNOSIS — Z5321 Procedure and treatment not carried out due to patient leaving prior to being seen by health care provider: Secondary | ICD-10-CM | POA: Diagnosis not present

## 2023-11-06 DIAGNOSIS — S6992XA Unspecified injury of left wrist, hand and finger(s), initial encounter: Secondary | ICD-10-CM | POA: Diagnosis present

## 2023-11-06 DIAGNOSIS — W228XXA Striking against or struck by other objects, initial encounter: Secondary | ICD-10-CM | POA: Insufficient documentation

## 2023-11-06 DIAGNOSIS — S62307A Unspecified fracture of fifth metacarpal bone, left hand, initial encounter for closed fracture: Secondary | ICD-10-CM | POA: Diagnosis not present

## 2023-11-06 DIAGNOSIS — M79642 Pain in left hand: Secondary | ICD-10-CM | POA: Diagnosis present

## 2023-11-06 NOTE — ED Provider Triage Note (Signed)
 Emergency Medicine Provider Triage Evaluation Note  Lynn Silva , a 38 y.o. female  was evaluated in triage.  Pt complains of left hand pain since Tuesday.  Patient came yesterday to the ED but needed to leave  Review of Systems  Positive:  Negative:   Physical Exam  There were no vitals taken for this visit. Gen:   Awake, no distress   Resp:  Normal effort  MSK:   Moves extremities without difficult Other:  Left hand; pulses positive skin intact tender to palpation in fourth and fifth metacarpal  Medical Decision Making  Medically screening exam initiated at 10:48 PM.  Appropriate orders placed.  Lynn Silva was informed that the remainder of the evaluation will be completed by another provider, this initial triage assessment does not replace that evaluation, and the importance of remaining in the ED until their evaluation is complete.  Patient with possible fracture of the left hand ordered x-ray   Janit Kast, PA-C 11/06/23 2249

## 2023-11-06 NOTE — ED Triage Notes (Signed)
 Pt to ED via POV c/o left hand injury. Pt hit hand on something Tuesday morning. [facility] but left, did not wait to see a doctor. Pt having swelling

## 2023-11-06 NOTE — ED Triage Notes (Signed)
 Patient states she hit her left hand while playing with son yesterday and woke up with increased swelling and pain.

## 2023-11-07 ENCOUNTER — Emergency Department
Admission: EM | Admit: 2023-11-07 | Discharge: 2023-11-07 | Disposition: A | Discharge status: Home / Self Care | Payer: No Typology Code available for payment source | Attending: Emergency Medicine | Admitting: Emergency Medicine

## 2023-11-07 DIAGNOSIS — S62307A Unspecified fracture of fifth metacarpal bone, left hand, initial encounter for closed fracture: Secondary | ICD-10-CM

## 2023-11-07 MED ORDER — IBUPROFEN 600 MG PO TABS: 600.0000 mg | ORAL_TABLET | Freq: Three times a day (TID) | ORAL | 0 refills | Status: AC | PRN

## 2023-11-07 MED ORDER — KETOROLAC TROMETHAMINE 60 MG/2ML IM SOLN
30.0000 mg | Freq: Once | INTRAMUSCULAR | Status: AC
  Administered 2023-11-07: 30 mg via INTRAMUSCULAR
  Filled 2023-11-07: qty 2

## 2023-11-07 MED ORDER — HYDROCODONE-ACETAMINOPHEN 5-325 MG PO TABS: 1.0000 | ORAL_TABLET | Freq: Four times a day (QID) | ORAL | 0 refills | Status: AC | PRN

## 2023-11-07 NOTE — Discharge Instructions (Signed)
 You may take pain medicines as needed.  Keep splint clean and dry.  Return to the ER for worsening symptoms or other concerns.
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
## 2024-07-29 ENCOUNTER — Emergency Department: Payer: Self-pay

## 2024-07-29 ENCOUNTER — Other Ambulatory Visit: Payer: Self-pay

## 2024-07-29 ENCOUNTER — Emergency Department
Admission: EM | Admit: 2024-07-29 | Discharge: 2024-07-29 | Disposition: A | Discharge status: Home / Self Care | Payer: Self-pay | Attending: Emergency Medicine | Admitting: Emergency Medicine

## 2024-07-29 DIAGNOSIS — R079 Chest pain, unspecified: Secondary | ICD-10-CM

## 2024-07-29 DIAGNOSIS — F17210 Nicotine dependence, cigarettes, uncomplicated: Secondary | ICD-10-CM | POA: Insufficient documentation

## 2024-07-29 DIAGNOSIS — Z716 Tobacco abuse counseling: Secondary | ICD-10-CM | POA: Insufficient documentation

## 2024-07-29 DIAGNOSIS — R0602 Shortness of breath: Secondary | ICD-10-CM | POA: Insufficient documentation

## 2024-07-29 DIAGNOSIS — R0789 Other chest pain: Secondary | ICD-10-CM | POA: Insufficient documentation

## 2024-07-29 LAB — BASIC METABOLIC PANEL WITH GFR
Anion gap: 10 (ref 5–15)
BUN: 7 mg/dL (ref 6–20)
CO2: 27 mmol/L (ref 22–32)
Calcium: 9.2 mg/dL (ref 8.9–10.3)
Chloride: 101 mmol/L (ref 98–111)
Creatinine, Ser: 0.63 mg/dL (ref 0.44–1.00)
GFR, Estimated: >60 mL/min (ref >60)
Glucose, Bld: 88 mg/dL (ref 70–99)
Potassium: 3.2 mmol/L — ABNORMAL LOW (ref 3.5–5.1)
Sodium: 138 mmol/L (ref 135–145)

## 2024-07-29 LAB — D-DIMER, QUANTITATIVE: D-Dimer, Quant: <0.27 ug{FEU}/mL (ref 0.00–0.50)

## 2024-07-29 LAB — HEPATIC FUNCTION PANEL
ALT: 18 U/L (ref 0–44)
AST: 21 U/L (ref 15–41)
Albumin: 3.8 g/dL (ref 3.5–5.0)
Alkaline Phosphatase: 41 U/L (ref 38–126)
Bilirubin, Direct: 0.1 mg/dL (ref 0.0–0.2)
Indirect Bilirubin: 0.6 mg/dL (ref 0.3–0.9)
Total Bilirubin: 0.7 mg/dL (ref 0.0–1.2)
Total Protein: 7.5 g/dL (ref 6.5–8.1)

## 2024-07-29 LAB — CBC
HCT: 38.2 % (ref 36.0–46.0)
Hemoglobin: 13.1 g/dL (ref 12.0–15.0)
MCH: 33.2 pg (ref 26.0–34.0)
MCHC: 34.3 g/dL (ref 30.0–36.0)
MCV: 96.7 fL (ref 80.0–100.0)
Platelets: 313 K/uL (ref 150–400)
RBC: 3.95 MIL/uL (ref 3.87–5.11)
RDW: 13.3 % (ref 11.5–15.5)
WBC: 8.2 K/uL (ref 4.0–10.5)
nRBC: 0 % (ref 0.0–0.2)

## 2024-07-29 LAB — POC URINE PREG, ED: Preg Test, Ur: NEGATIVE

## 2024-07-29 LAB — TROPONIN I (HIGH SENSITIVITY)
Troponin I (High Sensitivity): <2 ng/L (ref <18)
Troponin I (High Sensitivity): <2 ng/L (ref <18)

## 2024-07-29 LAB — LIPASE, BLOOD: Lipase: 31 U/L (ref 11–51)

## 2024-07-29 MED ORDER — ASPIRIN 81 MG PO CHEW
243.0000 mg | CHEWABLE_TABLET | Freq: Once | ORAL | Status: AC
  Administered 2024-07-29: 243 mg via ORAL
  Filled 2024-07-29: qty 3

## 2024-07-29 MED ORDER — NICOTINE POLACRILEX 4 MG MT LOZG: 4.0000 mg | LOZENGE | OROMUCOSAL | 0 refills | Status: AC | PRN

## 2024-07-29 MED ORDER — NICOTINE 7 MG/24HR TD PT24: 7.0000 mg | MEDICATED_PATCH | Freq: Every day | TRANSDERMAL | 0 refills | Status: AC

## 2024-07-29 NOTE — ED Notes (Signed)
 Patient ambulated independently to toilet, no CP, dizziness, or SOB.

## 2024-07-29 NOTE — ED Notes (Signed)
Pt ambulated to toilet in room without difficulty.
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
## 2024-07-29 NOTE — Discharge Instructions (Signed)
 Fortunately your evaluation in the emergency department did not show any emergency conditions a heart attack or blood clot that account for your symptoms last night.  Thank you for choosing us  for your health care today!  Please see your primary doctor this week for a follow up appointment.   If you have any new, worsening, or unexpected symptoms call your doctor right away or come back to the emergency department for reevaluation.  It was my pleasure to care for you today.   Ginnie EDISON Cyrena, MD

## 2024-07-29 NOTE — ED Triage Notes (Signed)
 Patient ambulatory to triage with complaints of chest pain starting around 11pm last night. Reports accompanying headache and dizziness with CP that started after smoke break at work. Patient took 1 baby aspirin PTA. Denies medical hx.
